# Patient Record
Sex: Female | Born: 1985
Health system: Southern US, Community
[De-identification: ages and names within clinical notes are randomized; demographics above are authoritative.]

## PROBLEM LIST (undated history)

## (undated) DIAGNOSIS — J189 Pneumonia, unspecified organism: Secondary | ICD-10-CM

## (undated) DIAGNOSIS — R51 Headache: Secondary | ICD-10-CM

## (undated) HISTORY — DX: Pneumonia, unspecified organism: J18.9

## (undated) HISTORY — PX: DENTAL SURGERY: SHX609

## (undated) HISTORY — DX: Headache: R51

---

## 2006-05-02 ENCOUNTER — Other Ambulatory Visit: Admission: RE | Admit: 2006-05-02 | Discharge: 2006-05-02 | Payer: Self-pay | Admitting: Gynecology

## 2006-07-18 ENCOUNTER — Ambulatory Visit: Payer: Self-pay | Admitting: Internal Medicine

## 2006-07-18 LAB — CONVERTED CEMR LAB
HCT: 35.8 % — ABNORMAL LOW (ref 36.0–46.0)
MCHC: 35 g/dL (ref 30.0–36.0)
MCV: 88.4 fL (ref 78.0–100.0)
Platelets: 235 10*3/uL (ref 150–400)
RDW: 11.8 % (ref 11.5–14.6)
WBC: 7.1 10*3/uL (ref 4.5–10.5)

## 2006-08-06 ENCOUNTER — Ambulatory Visit: Payer: Self-pay | Admitting: Internal Medicine

## 2007-04-06 ENCOUNTER — Ambulatory Visit: Payer: Self-pay | Admitting: Internal Medicine

## 2007-04-07 ENCOUNTER — Telehealth (INDEPENDENT_AMBULATORY_CARE_PROVIDER_SITE_OTHER): Payer: Self-pay | Admitting: *Deleted

## 2007-04-07 ENCOUNTER — Ambulatory Visit: Payer: Self-pay | Admitting: Internal Medicine

## 2007-04-07 LAB — CONVERTED CEMR LAB
Basophils Relative: 0.2 % (ref 0.0–1.0)
Cholesterol: 197 mg/dL (ref 0–200)
Glucose, Bld: 79 mg/dL (ref 70–99)
Monocytes Relative: 4.2 % (ref 3.0–11.0)
Neutro Abs: 5.8 10*3/uL (ref 1.4–7.7)
Platelets: 186 10*3/uL (ref 150–400)
RBC: 4.2 M/uL (ref 3.87–5.11)
RDW: 11.3 % — ABNORMAL LOW (ref 11.5–14.6)
TSH: 0.78 microintl units/mL (ref 0.35–5.50)
Transferrin: 283.8 mg/dL (ref >=212.0)

## 2007-09-07 ENCOUNTER — Other Ambulatory Visit: Admission: RE | Admit: 2007-09-07 | Discharge: 2007-09-07 | Payer: Self-pay | Admitting: Gynecology

## 2007-11-03 ENCOUNTER — Telehealth (INDEPENDENT_AMBULATORY_CARE_PROVIDER_SITE_OTHER): Payer: Self-pay | Admitting: *Deleted

## 2008-08-29 DIAGNOSIS — J189 Pneumonia, unspecified organism: Secondary | ICD-10-CM

## 2008-08-29 HISTORY — DX: Pneumonia, unspecified organism: J18.9

## 2009-03-29 DIAGNOSIS — R519 Headache, unspecified: Secondary | ICD-10-CM

## 2009-03-29 HISTORY — PX: BREAST ENHANCEMENT SURGERY: SHX7

## 2009-03-29 HISTORY — DX: Headache, unspecified: R51.9

## 2009-04-10 ENCOUNTER — Ambulatory Visit: Payer: Self-pay | Admitting: Internal Medicine

## 2009-04-11 ENCOUNTER — Encounter: Admission: RE | Admit: 2009-04-11 | Discharge: 2009-04-11 | Payer: Self-pay | Admitting: Internal Medicine

## 2009-04-11 ENCOUNTER — Ambulatory Visit: Payer: Self-pay | Admitting: Internal Medicine

## 2009-04-17 LAB — CONVERTED CEMR LAB
BUN: 13 mg/dL (ref 6–23)
Basophils Relative: 3 % (ref 0.0–3.0)
Chloride: 106 meq/L (ref 96–112)
Creatinine, Ser: 0.9 mg/dL (ref 0.4–1.2)
HCT: 37.6 % (ref 36.0–46.0)
Hemoglobin: 13 g/dL (ref 12.0–15.0)
Lymphocytes Relative: 30 % (ref 12.0–46.0)
Lymphs Abs: 1.4 10*3/uL (ref 0.7–4.0)
MCHC: 34.6 g/dL (ref 30.0–36.0)
Monocytes Relative: 7 % (ref 3.0–12.0)
Neutro Abs: 1.6 10*3/uL (ref 1.4–7.7)
RBC: 4.19 M/uL (ref 3.87–5.11)
RDW: 11.3 % — ABNORMAL LOW (ref 11.5–14.6)
WBC: 7.1 10*3/uL (ref 4.5–10.5)

## 2009-04-28 ENCOUNTER — Ambulatory Visit: Payer: Self-pay | Admitting: Internal Medicine

## 2009-04-28 DIAGNOSIS — G43909 Migraine, unspecified, not intractable, without status migrainosus: Secondary | ICD-10-CM | POA: Insufficient documentation

## 2009-05-01 ENCOUNTER — Telehealth: Payer: Self-pay | Admitting: Internal Medicine

## 2009-07-18 ENCOUNTER — Emergency Department (HOSPITAL_BASED_OUTPATIENT_CLINIC_OR_DEPARTMENT_OTHER): Admission: EM | Admit: 2009-07-18 | Discharge: 2009-07-18 | Payer: Self-pay | Admitting: Emergency Medicine

## 2009-07-18 ENCOUNTER — Ambulatory Visit: Payer: Self-pay | Admitting: Diagnostic Radiology

## 2009-08-11 ENCOUNTER — Ambulatory Visit: Payer: Self-pay | Admitting: Internal Medicine

## 2009-08-11 LAB — CONVERTED CEMR LAB
Bacteria, UA: NONE SEEN
Blood in Urine, dipstick: NEGATIVE
Ketones, urine, test strip: NEGATIVE
Nitrite: NEGATIVE
Urobilinogen, UA: 0.2

## 2009-08-12 ENCOUNTER — Encounter: Payer: Self-pay | Admitting: Internal Medicine

## 2009-11-02 ENCOUNTER — Ambulatory Visit: Payer: Self-pay | Admitting: Internal Medicine

## 2009-11-02 DIAGNOSIS — R21 Rash and other nonspecific skin eruption: Secondary | ICD-10-CM

## 2009-12-27 ENCOUNTER — Ambulatory Visit: Payer: Self-pay | Admitting: Gynecology

## 2009-12-27 ENCOUNTER — Other Ambulatory Visit: Admission: RE | Admit: 2009-12-27 | Discharge: 2009-12-27 | Payer: Self-pay | Admitting: Gynecology

## 2010-04-19 ENCOUNTER — Ambulatory Visit: Payer: Self-pay | Admitting: Family Medicine

## 2010-04-19 ENCOUNTER — Ambulatory Visit: Payer: Self-pay | Admitting: Diagnostic Radiology

## 2010-04-19 ENCOUNTER — Ambulatory Visit (HOSPITAL_BASED_OUTPATIENT_CLINIC_OR_DEPARTMENT_OTHER): Admission: RE | Admit: 2010-04-19 | Discharge: 2010-04-19 | Payer: Self-pay | Admitting: Family Medicine

## 2010-08-02 ENCOUNTER — Ambulatory Visit
Admission: RE | Admit: 2010-08-02 | Discharge: 2010-08-02 | Payer: Self-pay | Source: Home / Self Care | Attending: Internal Medicine | Admitting: Internal Medicine

## 2010-08-02 DIAGNOSIS — J019 Acute sinusitis, unspecified: Secondary | ICD-10-CM | POA: Insufficient documentation

## 2010-08-19 ENCOUNTER — Encounter: Payer: Self-pay | Admitting: Internal Medicine

## 2010-08-28 NOTE — Assessment & Plan Note (Signed)
Summary: cough//kn   Vital Signs:  Patient profile:   25 year old female Height:      65.5 inches (166.37 cm) Weight:      139.25 pounds (63.30 kg) BMI:     22.90 O2 Sat:      98 % on Room air Temp:     98.2 degrees F (36.78 degrees C) oral Pulse rate:   66 / minute BP sitting:   110 / 76  (left arm) Cuff size:   regular  Vitals Entered By: Lucious Groves CMA (April 19, 2010 11:49 AM)  O2 Flow:  Room air CC: C/O cough x2 weeks that has gotten progressively worse in the last 3 days./kb Is Patient Diabetic? No Pain Assessment Patient in pain? yes     Location: back Intensity: 3-4 Type: aching Onset of pain  continues, worse with cough Comments Patient notes that she had a history of pneumonia last year. Patient also had a cold 2 weeks ago, which to her, appeared to have cleared up./kb   History of Present Illness: 25 yo woman here today w/ cough.  pt had a cold 2 weeks ago and then developed a persistant dry cough.  otherwise felt well.  started taking Mucinex nightly w/ good relief.  over the last 3 days sxs have again worsened.  pt had PNA last year- had associated back pain.  now again having back pain.  no fevers.  cough is nonproductive.  no ear pain, nasal congestion.  no known sick contacts.  Current Medications (verified): 1)  Yasmin 28 3-0.03 Mg Tabs (Drospirenone-Ethinyl Estradiol) .... Per Gyn  Allergies (verified): No Known Drug Allergies  Past History:  Past Medical History: Last updated: 04/28/2009 pneumonia Dx RML 2008 pneumonia Dx 03-2009 HA (migraine Dx 03-2009  and  also mild recurrent HAs)  Review of Systems      See HPI  Physical Exam  General:  alert, well-developed, and well-nourished.   Head:  NCAT, no TTP over sinuses Eyes:  no injxn or inflammation Ears:  External ear exam shows no significant lesions or deformities.  Otoscopic examination reveals clear canals, tympanic membranes are intact bilaterally without bulging, retraction,  inflammation or discharge. Hearing is grossly normal bilaterally. Nose:  mild congestion Mouth:  + PND, otherwise normal Lungs:  normal respiratory effort, no intercostal retractions, no accessory muscle use, and normal breath sounds.   Heart:  normal rate, regular rhythm, no murmur, and no gallop.   Cervical Nodes:  No lymphadenopathy noted   Impression & Recommendations:  Problem # 1:  COUGH (ICD-786.2) Assessment New pt's cough initially sounded post-infectious.  now that it is worsening and pt having associated back pain, similar to her previous PNA, will get CXR.  start Cheratussin and tessalon as needed.  will review CXR prior to starting abx.  reviewed supportive care and red flags that should prompt return.  Pt expresses understanding and is in agreement w/ this plan. Orders: T-2 View CXR ([id number])  Complete Medication List: 1)  Yasmin 28 3-0.03 Mg Tabs (Drospirenone-ethinyl estradiol) .... Per gyn 2)  Cheratussin Ac 100-10 Mg/68ml Syrp (Guaifenesin-codeine) .Marland Kitchen.. 1-2 tsp q4-6 as needed for cough.  will cause drowsiness. 3)  Tessalon 200 Mg Caps (Benzonatate) .... Take one capsule by mouth three times a day as needed for cough  Patient Instructions: 1)  Go to the [facility] for your xray- we'll call you with your results 2)  Take the cough syrup at night or on the weekend- it will make  you sleepy 3)  Take the cough pills during the day as needed 4)  You can take the Mucinex w/ the cough pills but not the syrup 5)  Drink plenty of fluids 6)  Hang in there!!! Prescriptions: TESSALON 200 MG CAPS (BENZONATATE) Take one capsule by mouth three times a day as needed for cough  #60 x 0   Entered and Authorized by:   Neena Rhymes MD   Signed by:   Neena Rhymes MD on 04/19/2010   Method used:   Print then Give to Patient   RxID:   [id number] CHERATUSSIN AC 100-10 MG/5ML SYRP (GUAIFENESIN-CODEINE) 1-2 tsp q4-6 as needed for cough.  will cause drowsiness.  #145ml x  0   Entered and Authorized by:   Neena Rhymes MD   Signed by:   Neena Rhymes MD on 04/19/2010   Method used:   Print then Give to Patient   RxID:   [id number]

## 2010-08-28 NOTE — Assessment & Plan Note (Signed)
Summary: darkining brown spots on arm//lch   Vital Signs:  Patient profile:   25 year old female Height:      65.5 inches Weight:      142.4 pounds Pulse rate:   80 / minute BP sitting:   120 / 80  Vitals Entered By: Shary Decamp (November 02, 2009 9:52 AM) CC: brown spots on arms x 1 week, sore on left shoulder   History of Present Illness: one week history of spots in the arms they  started as red and now they are brown no itching no exposure to any new perfumes or poison ivy   also developed an area of infection close to the left shoulder she saw abundant  discharge coming from that area few  days ago now the area is sore and slightly red  Current Medications (verified): 1)  Yasmin 28 3-0.03 Mg Tabs (Drospirenone-Ethinyl Estradiol) .... Per Gyn  Allergies (verified): No Known Drug Allergies  Past History:  Past Medical History: Reviewed history from 04/28/2009 and no changes required. pneumonia Dx RML 2008 pneumonia Dx 03-2009 HA (migraine Dx 03-2009  and  also mild recurrent HAs)  Past Surgical History: Reviewed history from 04/10/2009 and no changes required. dental surgery breast implants 03-2009  Social History: Reviewed history from 08/11/2009 and no changes required. Single no children she is originally from [place] graduated from college , work in finances @ [employer]  Review of Systems       no fever  Physical Exam  General:  alert, well-developed, and well-nourished.   Skin:  macular  lesions, one in the right arm two in the left arm slightly darker than her skin, not scaly,clearly defined borders   chest, close to the left shoulder has a  3/4cm  area of redness   Impression & Recommendations:  Problem # 1:  RASH-NONVESICULAR (ICD-782.1) rash in the arms of unclear etiology, not likely fungal.  Hyperpigmentation from contact dermatitis? Recommend over-the-counter hydrocortisone 0.5% we agree that if  is no better and is cosmetically  bothersome, she will call for a dermatology referral  she has a resolving-small  absces in the left chest close to the  shoulder, according to the patient the area drained discharge  and now is getting more  red in the last 2 days  on examination  there is no fluctuancy keflex    for 5 days to call if not better in a few days  Complete Medication List: 1)  Yasmin 28 3-0.03 Mg Tabs (Drospirenone-ethinyl estradiol) .... Per gyn 2)  Keflex 750 Mg Caps (Cephalexin) .... One by mouth 3 times a day for 5 days  Patient Instructions: 1)  use hydrocortisone cream 0.5% over-the-counter in the arms 2)  keflex for 5 days 3)  call if not better soon Prescriptions: KEFLEX 750 MG CAPS (CEPHALEXIN) one by mouth 3 times a day for 5 days  #15 x 0   Entered and Authorized by:   Nolon Rod. Sara Keys MD   Signed by:   Nolon Rod. Kenesha Moshier MD on 11/02/2009   Method used:   Print then Give to Patient   RxID:   [id number]

## 2010-08-28 NOTE — Assessment & Plan Note (Signed)
Summary: ABDOMINAL PAIN//PH   Vital Signs:  Patient profile:   25 year old female Height:      65.5 inches Weight:      146.8 pounds Temp:     97.9 degrees F BP sitting:   110 / 60  Vitals Entered By: Shary Decamp (August 11, 2009 10:47 AM) CC: ACUTE ONLY Is Patient Diabetic? No Comments Patient was dx with BV on 12/21 @ ED & was rx'd flagyl, 1 week later developed dysuria & urinary freq,  2 days ago noted hematuria, low abd pain Shary Decamp  August 11, 2009 10:48 AM    History of Present Illness: went to the ER December 21 with abdominal pain, she was evaluated and diagnosed with vaginitis, Rx  Flagyl for one week. All her symptoms resolved  a week ago she developed dysuria and frequency yesterday she noted  gross hematuria twice took AZO OTC x1  Current Medications (verified): 1)  Treximet 85-500 Mg Tabs (Sumatriptan-Naproxen Sodium) .Marland Kitchen.. 1 By Mouth With The Onset of Ha The, May Repeat in 2 Hours If The Headac in Thehe Is Not Better 2)  Reglan 10 Mg Tabs (Metoclopramide Hcl) .Marland Kitchen.. 1 By Mouth With Each Treximet  Allergies (verified): No Known Drug Allergies  Past History:  Past Medical History: Reviewed history from 04/28/2009 and no changes required. pneumonia Dx RML 2008 pneumonia Dx 03-2009 HA (migraine Dx 03-2009  and  also mild recurrent HAs)  Past Surgical History: Reviewed history from 04/10/2009 and no changes required. dental surgery breast implants 03-2009  Social History: Reviewed history from 04/06/2007 and no changes required. Single no children she is originally from [place] graduated from college , work in finances @ [employer]  Review of Systems       denies fever no nausea or vomiting no abdominal pain  Physical Exam  General:  alert, well-developed, and well-nourished.   Abdomen:  soft, non-tender, no distention, no masses, no guarding, and no rigidity.  no CVA tenderness   Impression & Recommendations:  Problem # 1:  UTI  (ICD-599.0) sx c/w  UTI, recommend Cipro, push fluids, AZO OK to  take for a couple of days culture pending patient to call if not better in a few days Her updated medication list for this problem includes:    Cipro 500 Mg Tabs (Ciprofloxacin hcl) .Marland Kitchen... 1 by mouth two times a day  Complete Medication List: 1)  Treximet 85-500 Mg Tabs (Sumatriptan-naproxen sodium) .Marland Kitchen.. 1 by mouth with the onset of ha the, may repeat in 2 hours if the headac in thehe is not better 2)  Reglan 10 Mg Tabs (Metoclopramide hcl) .Marland Kitchen.. 1 by mouth with each treximet 3)  Yasmin 28 3-0.03 Mg Tabs (Drospirenone-ethinyl estradiol) .... Per gyn 4)  Cipro 500 Mg Tabs (Ciprofloxacin hcl) .Marland Kitchen.. 1 by mouth two times a day  Other Orders: UA Dipstick w/o Micro (manual) (14782) Specimen Handling (99000) T-Urine Microscopic ([id number]) T-Culture, Urine ([id number]) Prescriptions: CIPRO 500 MG TABS (CIPROFLOXACIN HCL) 1 by mouth two times a day  #10 x 0   Entered and Authorized by:   Nolon Rod. Elyssa Pendelton MD   Signed by:   Nolon Rod. Kellie Chisolm MD on 08/11/2009   Method used:   Print then Give to Patient   RxID:   [id number]   Laboratory Results   Urine Tests    Routine Urinalysis   Glucose: negative   (Normal Range: Negative) Bilirubin: negative   (Normal Range: Negative) Ketone: negative   (Normal Range: Negative) Spec.  Gravity: 1.010   (Normal Range: 1.003-1.035) Blood: negative   (Normal Range: Negative) pH: 5.0   (Normal Range: 5.0-8.0) Protein: negative   (Normal Range: Negative) Urobilinogen: 0.2   (Normal Range: 0-1) Nitrite: negative   (Normal Range: Negative) Leukocyte Esterace: trace   (Normal Range: Negative)

## 2010-08-30 NOTE — Assessment & Plan Note (Signed)
Summary: heavy cold,  went away, now its back///sph   Vital Signs:  Patient profile:   25 year old female Weight:      144 pounds Temp:     98.7 degrees F oral Pulse rate:   92 / minute Pulse rhythm:   regular BP sitting:   112 / 82  (left arm) Cuff size:   regular  Vitals Entered By: Army Fossa CMA (August 02, 2010 11:43 AM) CC: Pt here c/o Head/nasal congestion  Comments x 3 days taking dayquil and theraflu [facility]    History of Present Illness: Developed a cold 2 weeks ago, gradually got better. 3 days ago, her symptoms resurface, this time the main symptom is head and nose congestion. She has clear nasal discharge.  ROS No fever No sore throat Ears feel "full" No nausea, vomiting, diarrhea Chest is not congested, no cough  Current Medications (verified): 1)  Yasmin 28 3-0.03 Mg Tabs (Drospirenone-Ethinyl Estradiol) .... Per Gyn  Allergies (verified): No Known Drug Allergies  Past History:  Past Medical History: Reviewed history from 04/28/2009 and no changes required. pneumonia Dx RML 2008 pneumonia Dx 03-2009 HA (migraine Dx 03-2009  and  also mild recurrent HAs)  Past Surgical History: Reviewed history from 04/10/2009 and no changes required. dental surgery breast implants 03-2009  Social History: Reviewed history from 08/11/2009 and no changes required. Single no children she is originally from [place] graduated from college , work in finances @ [employer]  Physical Exam  General:  alert, well-developed, and well-nourished.   Head:  face symmetric, moderately tender at the right maxillary area Ears:  TMs slightly bulged, no red Nose:  congested Mouth:  no redness Lungs:  normal respiratory effort, no intercostal retractions, no accessory muscle use, and normal breath sounds.   Heart:  normal rate, regular rhythm, no murmur, and no gallop.     Impression & Recommendations:  Problem # 1:  SINUSITIS - ACUTE-NOS  (ICD-461.9) clinical picture consistent with acute sinusitis see  instructions   Her updated medication list for this problem includes:    Amoxicillin 500 Mg Tabs (Amoxicillin) .Marland Kitchen... 2 by mouth two times a day  Complete Medication List: 1)  Yasmin 28 3-0.03 Mg Tabs (Drospirenone-ethinyl estradiol) .... Per gyn 2)  Amoxicillin 500 Mg Tabs (Amoxicillin) .... 2 by mouth two times a day  Patient Instructions: 1)  rest, fluids, tylenol 2)  sudafed (behind the couinter) 30mg  4 times a day as needed 3)  mucinex 1 tab two times a day  4)  amoxicillin x 10 days 5)  omnaris 2 sprays on each side of the nose  once a  until samples gone 6)  call if no better in few days 7)  call if symptoms severe Prescriptions: AMOXICILLIN 500 MG TABS (AMOXICILLIN) 2 by mouth two times a day  #40 x 0   Entered and Authorized by:   Elita Quick E. Paz MD   Signed by:   Nolon Rod. Paz MD on 08/02/2010   Method used:   Print then Give to Patient   RxID:   [id number]    Orders Added: 1)  Est. Patient Level III [14782]

## 2010-10-29 LAB — BASIC METABOLIC PANEL
Calcium: 9.4 mg/dL (ref 8.4–10.5)
GFR calc Af Amer: >60 mL/min (ref >60)
GFR calc non Af Amer: >60 mL/min (ref >60)
Potassium: 4.3 mEq/L (ref 3.5–5.1)
Sodium: 142 mEq/L (ref 135–145)

## 2010-10-29 LAB — DIFFERENTIAL
Basophils Absolute: 0 10*3/uL (ref 0.0–0.1)
Basophils Relative: 1 % (ref 0–1)
Eosinophils Absolute: 0.5 10*3/uL (ref 0.0–0.7)
Eosinophils Relative: 6 % — ABNORMAL HIGH (ref 0–5)
Lymphocytes Relative: 28 % (ref 12–46)
Lymphs Abs: 2.3 10*3/uL (ref 0.7–4.0)
Monocytes Absolute: 0.4 10*3/uL (ref 0.1–1.0)
Monocytes Relative: 5 % (ref 3–12)
Neutro Abs: 5.3 10*3/uL (ref 1.7–7.7)
Neutrophils Relative %: 62 % (ref 43–77)

## 2010-10-29 LAB — URINALYSIS, ROUTINE W REFLEX MICROSCOPIC
Bilirubin Urine: NEGATIVE
Glucose, UA: NEGATIVE mg/dL
Hgb urine dipstick: NEGATIVE
Ketones, ur: NEGATIVE mg/dL
Nitrite: NEGATIVE
Protein, ur: NEGATIVE mg/dL
Specific Gravity, Urine: 1.019 (ref 1.005–1.030)
Urobilinogen, UA: 0.2 mg/dL (ref 0.0–1.0)
pH: 6 (ref 5.0–8.0)

## 2010-10-29 LAB — WET PREP, GENITAL
Trich, Wet Prep: NONE SEEN
Yeast Wet Prep HPF POC: NONE SEEN

## 2010-10-29 LAB — CBC
Hemoglobin: 13.5 g/dL (ref 12.0–15.0)
RBC: 4.36 MIL/uL (ref 3.87–5.11)
RDW: 11.3 % — ABNORMAL LOW (ref 11.5–15.5)
WBC: 8.5 10*3/uL (ref 4.0–10.5)

## 2010-10-29 LAB — PREGNANCY, URINE: Preg Test, Ur: NEGATIVE

## 2010-10-29 LAB — URINE MICROSCOPIC-ADD ON

## 2010-10-29 LAB — GC/CHLAMYDIA PROBE AMP, GENITAL
Chlamydia, DNA Probe: NEGATIVE
GC Probe Amp, Genital: NEGATIVE

## 2011-01-18 ENCOUNTER — Encounter: Payer: Self-pay | Admitting: Gynecology

## 2011-01-24 ENCOUNTER — Encounter: Payer: Self-pay | Admitting: Gynecology

## 2011-01-29 ENCOUNTER — Other Ambulatory Visit (HOSPITAL_COMMUNITY)
Admission: RE | Admit: 2011-01-29 | Discharge: 2011-01-29 | Disposition: A | Discharge status: Home / Self Care | Payer: 59 | Source: Ambulatory Visit | Attending: Gynecology | Admitting: Gynecology

## 2011-01-29 ENCOUNTER — Encounter (INDEPENDENT_AMBULATORY_CARE_PROVIDER_SITE_OTHER): Payer: 59 | Admitting: Gynecology

## 2011-01-29 ENCOUNTER — Other Ambulatory Visit: Payer: Self-pay | Admitting: Gynecology

## 2011-01-29 DIAGNOSIS — Z124 Encounter for screening for malignant neoplasm of cervix: Secondary | ICD-10-CM | POA: Insufficient documentation

## 2011-01-29 DIAGNOSIS — B373 Candidiasis of vulva and vagina: Secondary | ICD-10-CM

## 2011-01-29 DIAGNOSIS — Z113 Encounter for screening for infections with a predominantly sexual mode of transmission: Secondary | ICD-10-CM

## 2011-01-29 DIAGNOSIS — Z01419 Encounter for gynecological examination (general) (routine) without abnormal findings: Secondary | ICD-10-CM

## 2011-01-31 ENCOUNTER — Encounter: Payer: Self-pay | Admitting: *Deleted

## 2011-02-12 ENCOUNTER — Ambulatory Visit (INDEPENDENT_AMBULATORY_CARE_PROVIDER_SITE_OTHER): Payer: 59 | Admitting: Internal Medicine

## 2011-02-12 ENCOUNTER — Encounter: Payer: Self-pay | Admitting: Internal Medicine

## 2011-02-12 DIAGNOSIS — L709 Acne, unspecified: Secondary | ICD-10-CM | POA: Insufficient documentation

## 2011-02-12 DIAGNOSIS — L708 Other acne: Secondary | ICD-10-CM

## 2011-02-12 MED ORDER — CLINDAMYCIN PHOSPHATE 1 % EX GEL: CUTANEOUS | Status: DC

## 2011-02-12 NOTE — Progress Notes (Signed)
  Subjective:    Patient ID: Sara Moran, female    DOB: 02-Oct-1985, 25 y.o.   MRN: [id number]  HPI For the last 2 weeks has noted some acne,  Describes it as bumps under the skin, no blackheads. Usually when it gets better  it leaves "scars" (patient is referring to hyperpigmentation rather than scars)  Past Medical History  Diagnosis Date  . Pneumonia     dx RML 2008  . Pneumonia 08/2008  . HA (headache) 03/2009    migraine and also mild recurrent HAs   Past Surgical History  Procedure Date  . Breast enhancement surgery 03-2009    silicone  . Dental surgery      Review of Systems Has not  taken any new medication, has not changed her diet. She did skip her control pills for 2 months, when back to take yaz 4 weeks ago    Objective:   Physical Exam Alert oriented, no apparent distress. Face  with several small bumps around the nose and the mouth. There are consistent with  acne. No redness, no fluctuance, no actual scar, mild   hyperpigmentation in some areas

## 2011-02-12 NOTE — Assessment & Plan Note (Signed)
Mild acne, related to birth control pills? See history of present illness. Plan to treat her with clindamycin, for 2 months. If no better we'll consider a different antibiotic or benzoyl. See instructions

## 2011-02-12 NOTE — Patient Instructions (Signed)
Use clinda twice a day x 2 months , if no better or if acne returns, let me know

## 2011-03-24 IMAGING — CR DG CHEST 2V
2 series · 2 of 2 positions shown · non-contrast
Comparison: 04/11/2009

CLINICAL DATA: Cough and upper back pain.  History of pneumonia and
breast implants

CHEST - 2 VIEW

[w chest pa]
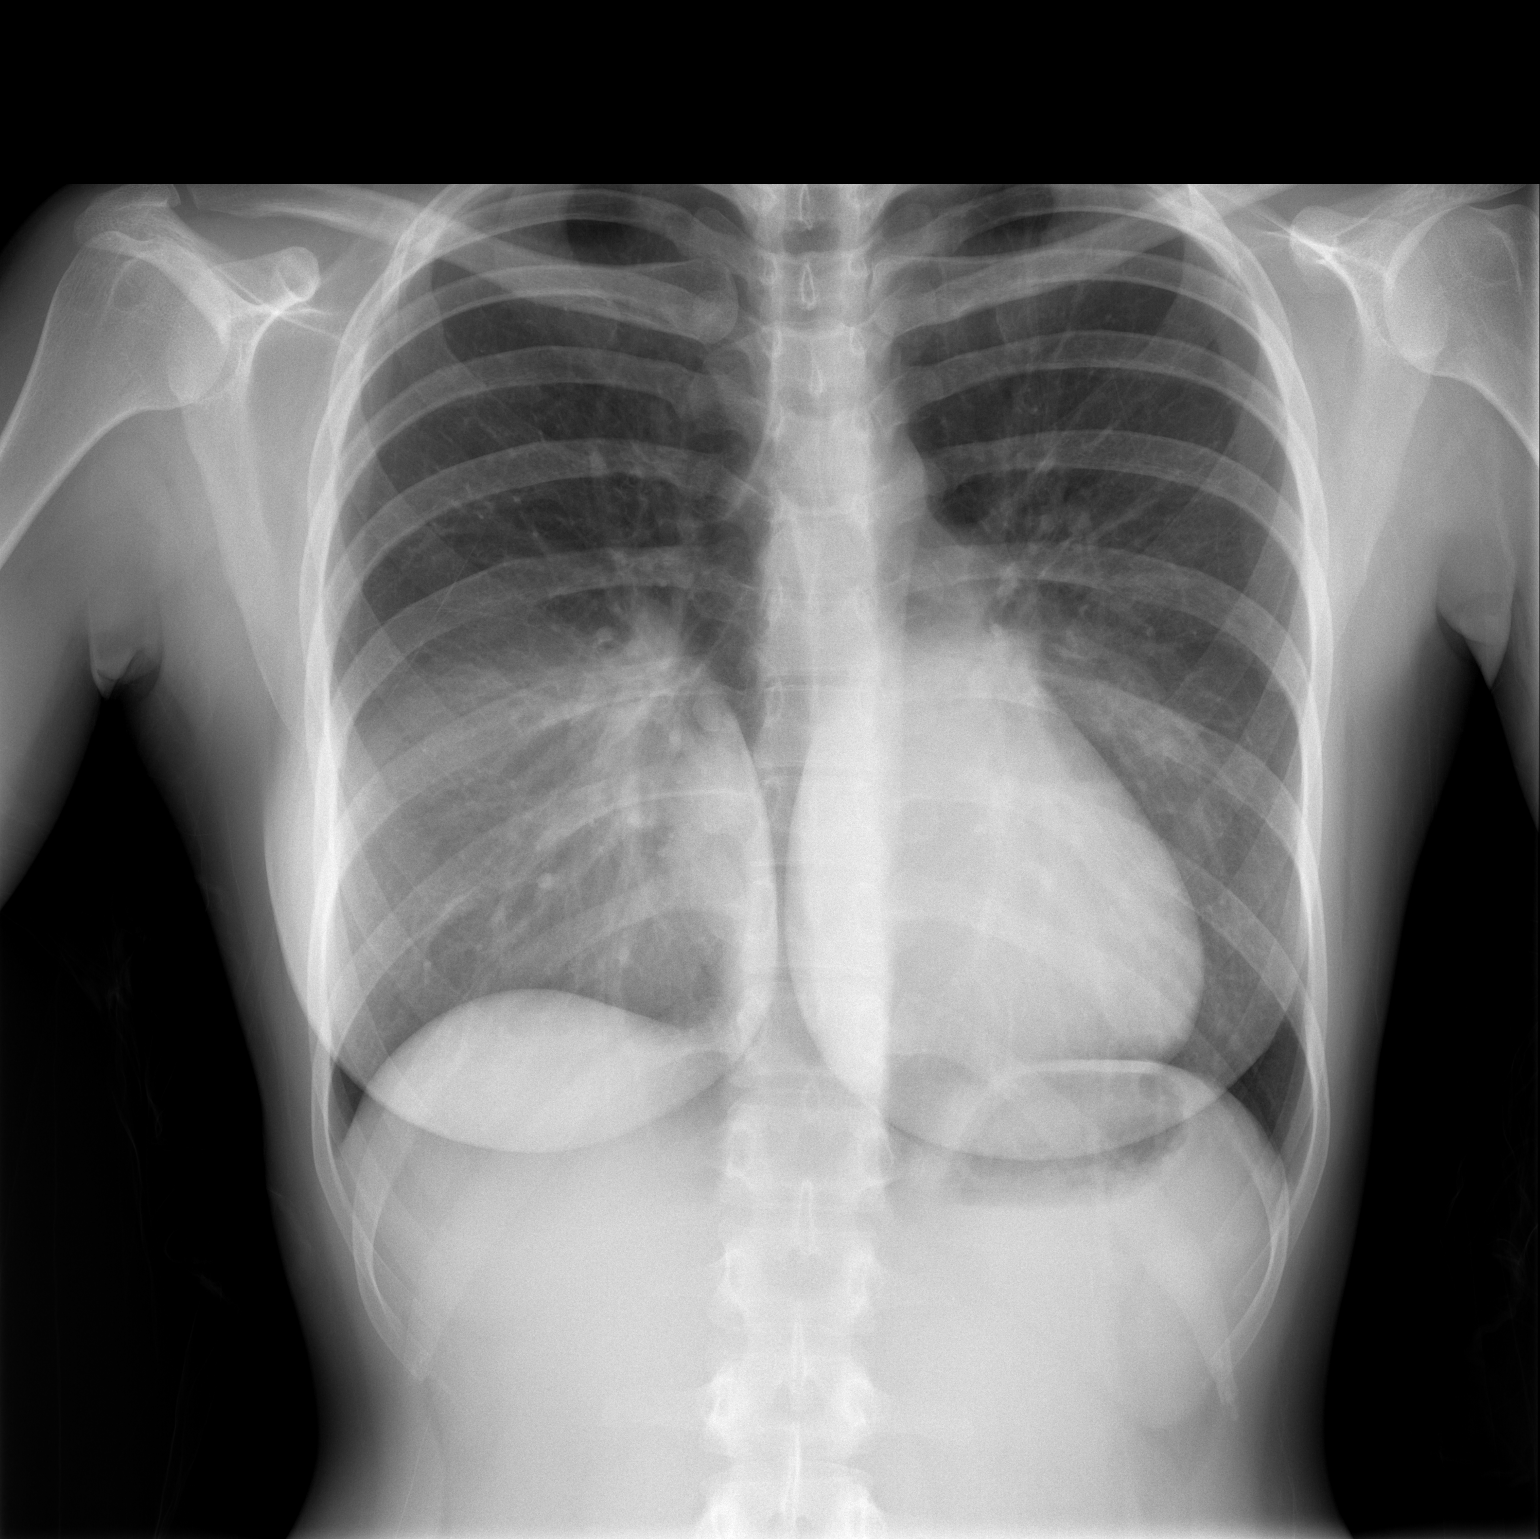

[w chest lat]
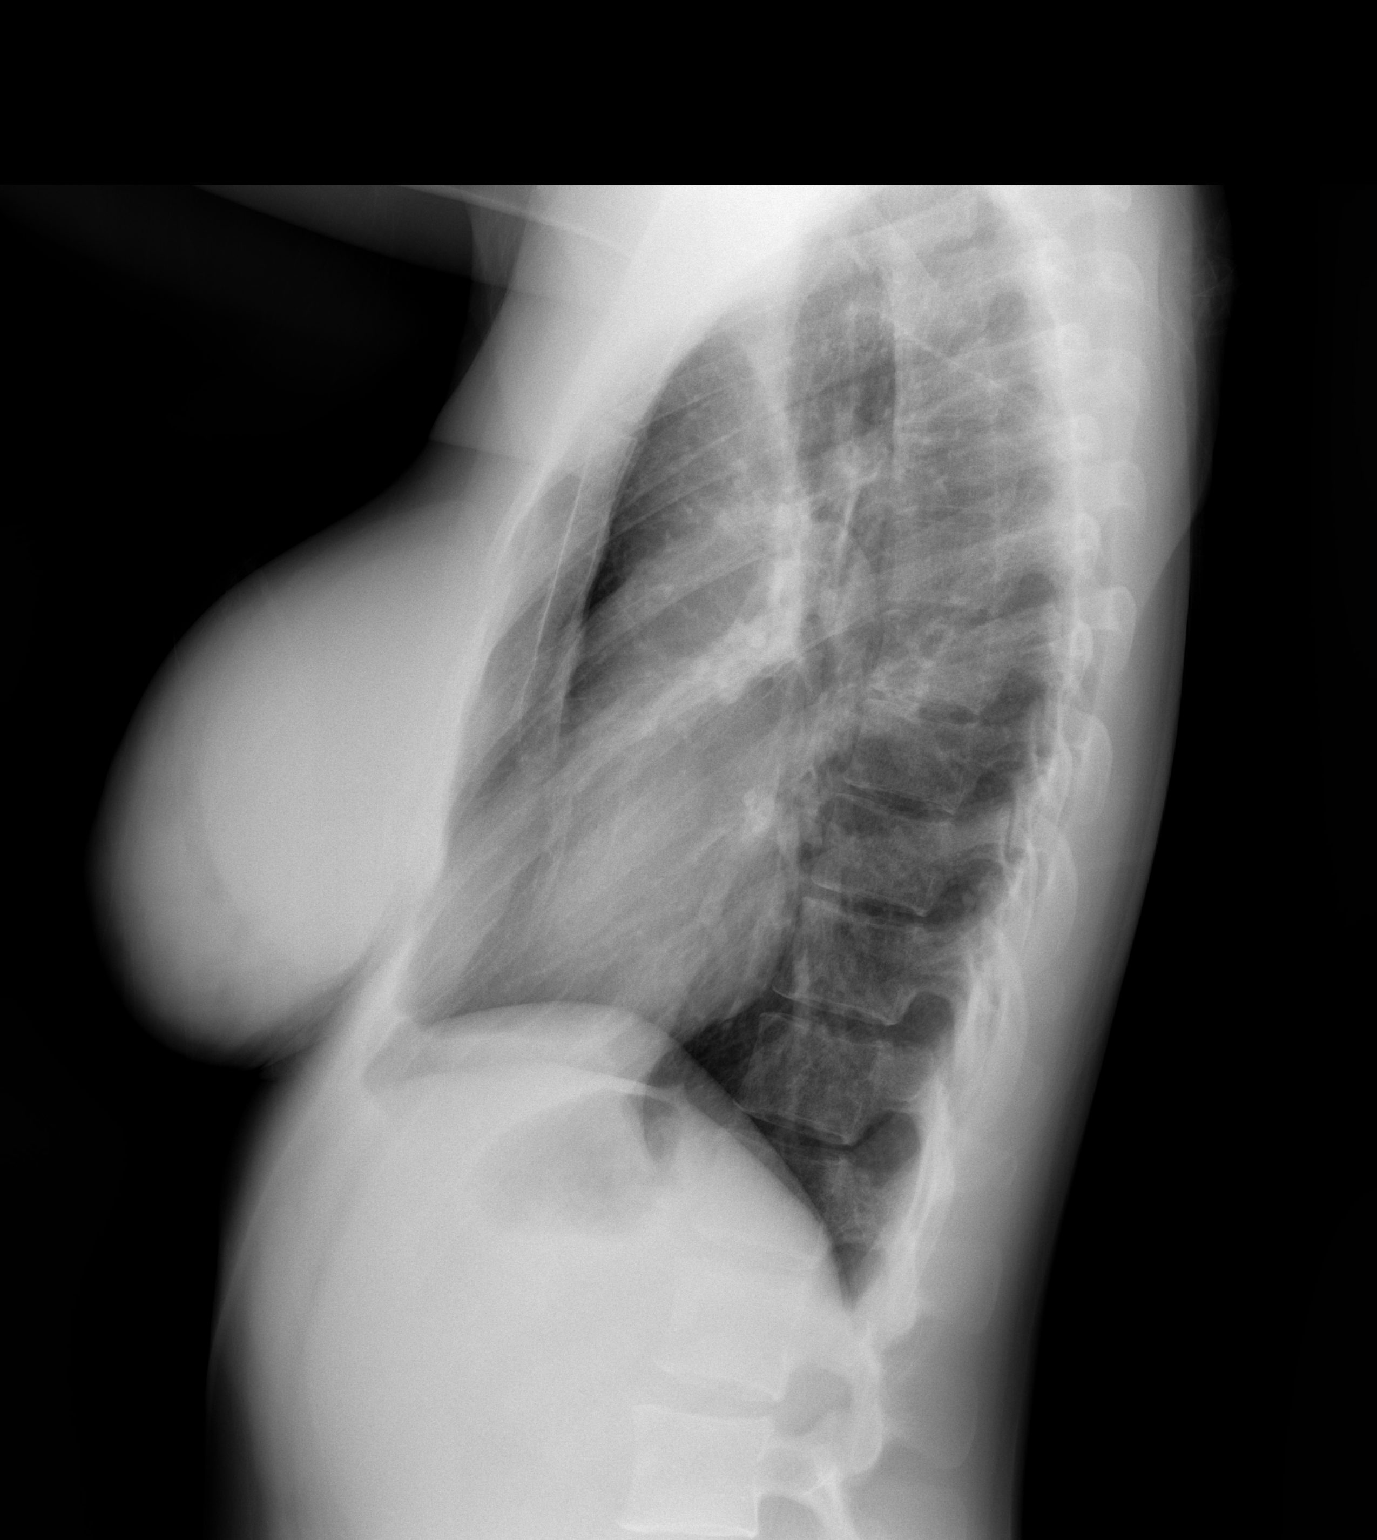

[2 of 2 positions shown; findings below may reference images not displayed]

FINDINGS: Bilateral breast implants are in place and result in an
increase in density over the lower aspect of the chest on the
frontal exam.  Heart and mediastinal contours are within normal
limits.  The lung fields are clear with no signs of focal
infiltrate or congestive failure.  No pleural fluid or
peribronchial cuffing is seen.

Bony structures are intact.
IMPRESSION: No acute or new focal cardiopulmonary abnormality noted.

## 2011-06-17 ENCOUNTER — Encounter: Payer: Self-pay | Admitting: Internal Medicine

## 2011-06-17 ENCOUNTER — Ambulatory Visit (INDEPENDENT_AMBULATORY_CARE_PROVIDER_SITE_OTHER): Payer: 59 | Admitting: Internal Medicine

## 2011-06-17 VITALS — BP 110/70 | HR 66 | Temp 98.8°F | Ht 65.0 in | Wt 143.0 lb

## 2011-06-17 DIAGNOSIS — L709 Acne, unspecified: Secondary | ICD-10-CM

## 2011-06-17 DIAGNOSIS — R196 Halitosis: Secondary | ICD-10-CM

## 2011-06-17 DIAGNOSIS — R6889 Other general symptoms and signs: Secondary | ICD-10-CM

## 2011-06-17 DIAGNOSIS — L708 Other acne: Secondary | ICD-10-CM

## 2011-06-17 MED ORDER — FLUTICASONE PROPIONATE 50 MCG/ACT NA SUSP: 2.0000 | Freq: Every day | NASAL | Status: DC

## 2011-06-17 MED ORDER — CLINDAMYCIN PHOSPHATE 1 % EX GEL: CUTANEOUS | Status: AC

## 2011-06-17 NOTE — Assessment & Plan Note (Signed)
Request a refill on Clindagel which is provided

## 2011-06-17 NOTE — Patient Instructions (Signed)
flonase x several weeks Prilosec 20 mg OTC 1 every morning before breakfast x 6 weeks

## 2011-06-17 NOTE — Progress Notes (Signed)
  Subjective:    Patient ID: Sara Moran, female    DOB: 1986-07-28, 25 y.o.   MRN: [id number]  HPI 2 months history of a metallic taste in her mouth and also has halithosis. Worse for the last 2 weeks. She is very careful with flossing, dental hygine and  dental appointments. Wonders if it is her tonsils that are causing her sx   Past Medical History  Diagnosis Date  . Pneumonia     dx RML 2008  . Pneumonia 08/2008  . HA (headache) 03/2009    migraine and also mild recurrent HAs   Past Surgical History  Procedure Date  . Breast enhancement surgery 03-2009    silicone  . Dental surgery      Review of Systems No fever or chills No actual sore throat or recent URIs No cough Occasionally has heartburn occational has postnasal drip without itching in the nose or eyes    Objective:   Physical Exam  Constitutional: She appears well-developed.  HENT:  Head: Normocephalic and atraumatic.  Right Ear: External ear normal.  Left Ear: External ear normal.  Nose: Nose normal.  Mouth/Throat: No oropharyngeal exudate.       Tonsils very small without discharge  Cardiovascular: Normal rate, regular rhythm and normal heart sounds.   No murmur heard. Pulmonary/Chest: Effort normal and breath sounds normal. No respiratory distress. She has no wheezes. She has no rales.  Lymphadenopathy:    She has no cervical adenopathy.       Assessment & Plan:  Halitosis: No apparent dental issues, review of systems positive for postnasal dripping and GERD. Will treat both conditions with Flonase and Prilosec, if not better she'll let me know.

## 2011-08-09 ENCOUNTER — Ambulatory Visit (INDEPENDENT_AMBULATORY_CARE_PROVIDER_SITE_OTHER): Payer: 59 | Admitting: Internal Medicine

## 2011-08-09 VITALS — BP 100/62 | HR 79 | Temp 98.7°F | Wt 143.0 lb

## 2011-08-09 DIAGNOSIS — R05 Cough: Secondary | ICD-10-CM

## 2011-08-09 NOTE — Patient Instructions (Signed)
Get the XR done Rest, fluids , tylenol For cough, take Mucinex DM twice a day as needed  Call if no better in few days Call anytime if the symptoms are severe, you have high fever, short of breath , chest pain

## 2011-08-09 NOTE — Progress Notes (Signed)
  Subjective:    Patient ID: Sara Moran, female    DOB: [birth year], 25 y.o.   MRN: [id number]  HPI Acute visit 5 days history of cough, taking Mucinex which is helping a little. She is concerned because she has developed thoracic pain for the last 2 days, is worse with cough. Denies any anterior or lateral chest pain.  Past Medical History: pneumonia Dx RML 2008 pneumonia Dx 03-2009 HA (migraine Dx 03-2009  and  also mild recurrent HAs)  Past Surgical History: dental surgery breast implants 03-2009  Social History: Married, G0P0 she is originally from [place] graduated from college , work in finances @ [employer]  Review of Systems No fever or chills No nausea or vomiting No actual sputum production. No sinus congestion or pain.    Objective:   Physical Exam  Constitutional: She is oriented to person, place, and time. She appears well-developed and well-nourished.  HENT:  Head: Normocephalic and atraumatic.  Right Ear: External ear normal.  Left Ear: External ear normal.  Nose: Nose normal.  Mouth/Throat: No oropharyngeal exudate.  Cardiovascular: Normal rate, regular rhythm and normal heart sounds.   No murmur heard. Pulmonary/Chest: Effort normal and breath sounds normal. No respiratory distress. She has no wheezes. She has no rales.  Musculoskeletal: She exhibits no edema.  Neurological: She is alert and oriented to person, place, and time.     Assessment & Plan:  Cough: Presents w/ a h/o cough with thoracic pain. Exam is benign. She is concerned because her history of previous pneumonia twice. I entered a  order for a chest x-ray to be sure she is not developing pneumonia, she likes to wait a couple of days but will proceed with a chest x-ray next week if she's not feeling better. Recommend conservative treatment for now, seen instructions

## 2011-08-10 ENCOUNTER — Encounter: Payer: Self-pay | Admitting: Internal Medicine

## 2012-10-16 ENCOUNTER — Encounter: Payer: 59 | Admitting: Gynecology

## 2012-10-20 ENCOUNTER — Ambulatory Visit: Payer: 59 | Admitting: Internal Medicine

## 2012-10-22 ENCOUNTER — Encounter: Payer: 59 | Admitting: Gynecology

## 2012-10-28 ENCOUNTER — Telehealth: Payer: Self-pay

## 2012-10-28 ENCOUNTER — Other Ambulatory Visit (HOSPITAL_COMMUNITY)
Admission: RE | Admit: 2012-10-28 | Discharge: 2012-10-28 | Disposition: A | Discharge status: Home / Self Care | Payer: BC Managed Care – PPO | Source: Ambulatory Visit | Attending: Gynecology | Admitting: Gynecology

## 2012-10-28 ENCOUNTER — Ambulatory Visit (HOSPITAL_COMMUNITY)
Admission: RE | Admit: 2012-10-28 | Discharge: 2012-10-28 | Disposition: A | Discharge status: Home / Self Care | Payer: BC Managed Care – PPO | Source: Ambulatory Visit | Attending: Gynecology | Admitting: Gynecology

## 2012-10-28 ENCOUNTER — Encounter: Payer: Self-pay | Admitting: Gynecology

## 2012-10-28 ENCOUNTER — Ambulatory Visit (INDEPENDENT_AMBULATORY_CARE_PROVIDER_SITE_OTHER): Payer: BC Managed Care – PPO | Admitting: Gynecology

## 2012-10-28 VITALS — BP 112/70 | Ht 65.75 in | Wt 145.0 lb

## 2012-10-28 DIAGNOSIS — A499 Bacterial infection, unspecified: Secondary | ICD-10-CM

## 2012-10-28 DIAGNOSIS — Q676 Pectus excavatum: Secondary | ICD-10-CM | POA: Insufficient documentation

## 2012-10-28 DIAGNOSIS — J3489 Other specified disorders of nose and nasal sinuses: Secondary | ICD-10-CM | POA: Insufficient documentation

## 2012-10-28 DIAGNOSIS — Z01419 Encounter for gynecological examination (general) (routine) without abnormal findings: Secondary | ICD-10-CM

## 2012-10-28 DIAGNOSIS — R059 Cough, unspecified: Secondary | ICD-10-CM | POA: Insufficient documentation

## 2012-10-28 DIAGNOSIS — N760 Acute vaginitis: Secondary | ICD-10-CM

## 2012-10-28 DIAGNOSIS — R05 Cough: Secondary | ICD-10-CM

## 2012-10-28 DIAGNOSIS — Z3041 Encounter for surveillance of contraceptive pills: Secondary | ICD-10-CM

## 2012-10-28 DIAGNOSIS — N898 Other specified noninflammatory disorders of vagina: Secondary | ICD-10-CM

## 2012-10-28 LAB — CBC WITH DIFFERENTIAL/PLATELET
Basophils Relative: 0 % (ref 0–1)
Hemoglobin: 13.7 g/dL (ref 12.0–15.0)
Lymphocytes Relative: 29 % (ref 12–46)
MCHC: 34.4 g/dL (ref 30.0–36.0)
Monocytes Relative: 6 % (ref 3–12)
Neutro Abs: 3.8 10*3/uL (ref 1.7–7.7)
Neutrophils Relative %: 58 % (ref 43–77)
RBC: 4.75 MIL/uL (ref 3.87–5.11)
WBC: 6.5 10*3/uL (ref 4.0–10.5)

## 2012-10-28 LAB — WET PREP FOR TRICH, YEAST, CLUE: Trich, Wet Prep: NONE SEEN

## 2012-10-28 LAB — GLUCOSE, RANDOM: Glucose, Bld: 79 mg/dL (ref 70–99)

## 2012-10-28 LAB — TSH: TSH: 2.36 u[IU]/mL (ref 0.350–4.500)

## 2012-10-28 MED ORDER — DROSPIRENONE-ETHINYL ESTRADIOL 3-0.03 MG PO TABS: 1.0000 | ORAL_TABLET | Freq: Every day | ORAL | Status: DC

## 2012-10-28 NOTE — Telephone Encounter (Signed)
After several phone calls I located TB tine test at [facility].  I called patient back and told her she could walk-in for test there. $27 dollars and they do not file insurance.  She can go by anytime except Thurs and must be before 4:30.  She will plan to go this week.  She does not have to be [employer].

## 2012-10-28 NOTE — Patient Instructions (Addendum)
Tetanus, Diphtheria, Pertussis (Tdap) Vaccine What You Need to Know WHY GET VACCINATED? Tetanus, diphtheria and pertussis can be very serious diseases, even for adolescents and adults. Tdap vaccine can protect us from these diseases. TETANUS (Lockjaw) causes painful muscle tightening and stiffness, usually all over the body.  It can lead to tightening of muscles in the head and neck so you can't open your mouth, swallow, or sometimes even breathe. Tetanus kills about 1 out of 5 people who are infected. DIPHTHERIA can cause a thick coating to form in the back of the throat.  It can lead to breathing problems, paralysis, heart failure, and death. PERTUSSIS (Whooping Cough) causes severe coughing spells, which can cause difficulty breathing, vomiting and disturbed sleep.  It can also lead to weight loss, incontinence, and rib fractures. Up to 2 in 100 adolescents and 5 in 100 adults with pertussis are hospitalized or have complications, which could include pneumonia and death. These diseases are caused by bacteria. Diphtheria and pertussis are spread from person to person through coughing or sneezing. Tetanus enters the body through cuts, scratches, or wounds. Before vaccines, the [place] saw as many as 200,000 cases a year of diphtheria and pertussis, and hundreds of cases of tetanus. Since vaccination began, tetanus and diphtheria have dropped by about 99% and pertussis by about 80%. TDAP VACCINE Tdap vaccine can protect adolescents and adults from tetanus, diphtheria, and pertussis. One dose of Tdap is routinely given at age 11 or 12. People who did not get Tdap at that age should get it as soon as possible. Tdap is especially important for health care professionals and anyone having close contact with a baby younger than 12 months. Pregnant women should get a dose of Tdap during every pregnancy, to protect the newborn from pertussis. Infants are most at risk for severe, life-threatening  complications from pertussis. A similar vaccine, called Td, protects from tetanus and diphtheria, but not pertussis. A Td booster should be given every 10 years. Tdap may be given as one of these boosters if you have not already gotten a dose. Tdap may also be given after a severe cut or burn to prevent tetanus infection. Your doctor can give you more information. Tdap may safely be given at the same time as other vaccines. SOME PEOPLE SHOULD NOT GET THIS VACCINE  If you ever had a life-threatening allergic reaction after a dose of any tetanus, diphtheria, or pertussis containing vaccine, OR if you have a severe allergy to any part of this vaccine, you should not get Tdap. Tell your doctor if you have any severe allergies.  If you had a coma, or long or multiple seizures within 7 days after a childhood dose of DTP or DTaP, you should not get Tdap, unless a cause other than the vaccine was found. You can still get Td.  Talk to your doctor if you:  have epilepsy or another nervous system problem,  had severe pain or swelling after any vaccine containing diphtheria, tetanus or pertussis,  ever had Guillain-Barr Syndrome (GBS),  aren't feeling well on the day the shot is scheduled. RISKS OF A VACCINE REACTION With any medicine, including vaccines, there is a chance of side effects. These are usually mild and go away on their own, but serious reactions are also possible. Brief fainting spells can follow a vaccination, leading to injuries from falling. Sitting or lying down for about 15 minutes can help prevent these. Tell your doctor if you feel dizzy or light-headed, or   have vision changes or ringing in the ears. Mild problems following Tdap (Did not interfere with activities)  Pain where the shot was given (about 3 in 4 adolescents or 2 in 3 adults)  Redness or swelling where the shot was given (about 1 person in 5)  Mild fever of at least 100.31F (up to about 1 in 25 adolescents or 1 in  100 adults)  Headache (about 3 or 4 people in 10)  Tiredness (about 1 person in 3 or 4)  Nausea, vomiting, diarrhea, stomach ache (up to 1 in 4 adolescents or 1 in 10 adults)  Chills, body aches, sore joints, rash, swollen glands (uncommon) Moderate problems following Tdap (Interfered with activities, but did not require medical attention)  Pain where the shot was given (about 1 in 5 adolescents or 1 in 100 adults)  Redness or swelling where the shot was given (up to about 1 in 16 adolescents or 1 in 25 adults)  Fever over 102F (about 1 in 100 adolescents or 1 in 250 adults)  Headache (about 3 in 20 adolescents or 1 in 10 adults)  Nausea, vomiting, diarrhea, stomach ache (up to 1 or 3 people in 100)  Swelling of the entire arm where the shot was given (up to about 3 in 100). Severe problems following Tdap (Unable to perform usual activities, required medical attention)  Swelling, severe pain, bleeding and redness in the arm where the shot was given (rare). A severe allergic reaction could occur after any vaccine (estimated less than 1 in a million doses). WHAT IF THERE IS A SERIOUS REACTION? What should I look for?  Look for anything that concerns you, such as signs of a severe allergic reaction, very high fever, or behavior changes. Signs of a severe allergic reaction can include hives, swelling of the face and throat, difficulty breathing, a fast heartbeat, dizziness, and weakness. These would start a few minutes to a few hours after the vaccination. What should I do?  If you think it is a severe allergic reaction or other emergency that can't wait, call 9-1-1 or get the person to the nearest hospital. Otherwise, call your doctor.  Afterward, the reaction should be reported to the "Vaccine Adverse Event Reporting System" (VAERS). Your doctor might file this report, or you can do it yourself through the VAERS web site at www.vaers.LAgents.no, or by calling 1-(226) 265-6479. VAERS is  only for reporting reactions. They do not give medical advice.  THE NATIONAL VACCINE INJURY COMPENSATION PROGRAM The National Vaccine Injury Compensation Program (VICP) is a federal program that was created to compensate people who may have been injured by certain vaccines. Persons who believe they may have been injured by a vaccine can learn about the program and about filing a claim by calling 1-(859) 099-2766 or visiting the VICP website at SpiritualWord.at. HOW CAN I LEARN MORE?  Ask your doctor.  Call your local or state health department.  Contact the Centers for Disease Control and Prevention (CDC):  Call (718)406-1840 or visit CDC's website at PicCapture.uy CDC Tdap Vaccine VIS (12/05/11) Document Released: 01/14/2012 Document Reviewed: 01/14/2012 [facility] Patient Information 2013 [place], [place].  TB (Tuberculosis) Tine Test The tuberculin tine test is used to determine whether someone has come in contact with the bacteria that cause a disease called tuberculosis. HOW THE TEST IS DONE  This test uses a tiny spiked instrument to inject a small amount of the tuberculosis dead protein material (antigen) just under your skin. This is most commonly done on the  arm. Usually, the area is marked with an ink pen. That way it can be checked for any redness and swelling. It is usually checked in 2 to 3 days. Note: Another test, called the "tuberculin skin test" (also called a PPD), is more accurate than the TB tine test. It is the preferred method of determining exposure to tuberculosis. PREPARATION FOR TEST  There is no special preparation. People with a skin rash or other skin irritations on their arms may need to have the test performed at a different spot on the body. HOW THE TEST WILL FEEL  Some people feel a slight stinging sensation when the instrument is inserted under the skin. After the test, the area may itch or burn. MEANING OF THE TEST  This test  helps determine if you have ever been exposed to a disease called tuberculosis. If so, your immune system produced antibodies to help fight the disease. These remain in your body. When this test is performed, those with antibodies to tuberculosis will have a positive test result. OBTAINING THE TESTRESULTS The area needs to be checked for any redness and swelling at a later time, usually in 2 to 3 days. Follow your caregiver's instructions as to where and when to report for this to be done. It is your responsibility to obtain your test results. Ask the lab or department performing the test when and how you will get your results. NORMAL FINDINGS  If you have a negative test result, the area may be a little red, but will not be swollen and firm like a mosquito bite. This means you have not been exposed to the bacteria that cause tuberculosis. WHAT ABNORMAL RESULTS MEAN  If you have been exposed to tuberculosis, the area will become red and swell like a mosquito bit in 48 to 72 hours. This is considered a positive test result. It means your body's immune system detected the substance injected under your skin. A positive TB tine test does not mean that you have active tuberculosis. It only means that you have been exposed at some point in the past.  A chest x-ray may be taken to evaluate whether you have active tuberculosis. Once you have been exposed, all future TB tine tests will be positive. If you have a positive TB tine test, the Centers for Disease Control and Prevention (CDC) recommends that you have a TB skin test (PPD, see above), unless the first tine test had a blistering or very large reaction. RISKSAND COMPLICATIONS  The risk of severe side effects is very low. In the unlikely event that a side effect occurs, typical ones include itching and hives. Rarely, the area may blister, or the area of swelling may become very large. Tell your health care provider if you have any severe  reactions. CONSIDERATIONS  The test results may be incorrect (false negative). False negative means the test suggests you have not been exposed to tuberculosis, but you really have been. This is more likely in the elderly and in patients with weakened immune systems, such as:  AIDS patients  Cancer patients who receive chemotherapy  Those who receive organ transplants  Anyone taking high doses of prescription steroid medication Document Released: 12/30/2006 Document Revised: 10/07/2011 Document Reviewed: 01/26/2007 [facility] Patient Information 2013 [place], [place].  Bacterial Vaginosis Bacterial vaginosis (BV) is a vaginal infection where the normal balance of bacteria in the vagina is disrupted. The normal balance is then replaced by an overgrowth of certain bacteria. There are several different kinds  of bacteria that can cause BV. BV is the most common vaginal infection in women of childbearing age. CAUSES   The cause of BV is not fully understood. BV develops when there is an increase or imbalance of harmful bacteria.  Some activities or behaviors can upset the normal balance of bacteria in the vagina and put women at increased risk including:  Having a new sex partner or multiple sex partners.  Douching.  Using an intrauterine device (IUD) for contraception.  It is not clear what role sexual activity plays in the development of BV. However, women that have never had sexual intercourse are rarely infected with BV. Women do not get BV from toilet seats, bedding, swimming pools or from touching objects around them.  SYMPTOMS   Grey vaginal discharge.  A fish-like odor with discharge, especially after sexual intercourse.  Itching or burning of the vagina and vulva.  Burning or pain with urination.  Some women have no signs or symptoms at all. DIAGNOSIS  Your caregiver must examine the vagina for signs of BV. Your caregiver will perform lab tests and look at the sample of  vaginal fluid through a microscope. They will look for bacteria and abnormal cells (clue cells), a pH test higher than 4.5, and a positive amine test all associated with BV.  RISKS AND COMPLICATIONS   Pelvic inflammatory disease (PID).  Infections following gynecology surgery.  Developing HIV.  Developing herpes virus. TREATMENT  Sometimes BV will clear up without treatment. However, all women with symptoms of BV should be treated to avoid complications, especially if gynecology surgery is planned. Female partners generally do not need to be treated. However, BV may spread between female sex partners so treatment is helpful in preventing a recurrence of BV.   BV may be treated with antibiotics. The antibiotics come in either pill or vaginal cream forms. Either can be used with nonpregnant or pregnant women, but the recommended dosages differ. These antibiotics are not harmful to the baby.  BV can recur after treatment. If this happens, a second round of antibiotics will often be prescribed.  Treatment is important for pregnant women. If not treated, BV can cause a premature delivery, especially for a pregnant woman who had a premature birth in the past. All pregnant women who have symptoms of BV should be checked and treated.  For chronic reoccurrence of BV, treatment with a type of prescribed gel vaginally twice a week is helpful. HOME CARE INSTRUCTIONS   Finish all medication as directed by your caregiver.  Do not have sex until treatment is completed.  Tell your sexual partner that you have a vaginal infection. They should see their caregiver and be treated if they have problems, such as a mild rash or itching.  Practice safe sex. Use condoms. Only have 1 sex partner. PREVENTION  Basic prevention steps can help reduce the risk of upsetting the natural balance of bacteria in the vagina and developing BV:  Do not have sexual intercourse (be abstinent).  Do not douche.  Use all of  the medicine prescribed for treatment of BV, even if the signs and symptoms go away.  Tell your sex partner if you have BV. That way, they can be treated, if needed, to prevent reoccurrence. SEEK MEDICAL CARE IF:   Your symptoms are not improving after 3 days of treatment.  You have increased discharge, pain, or fever. MAKE SURE YOU:   Understand these instructions.  Will watch your condition.  Will get help  right away if you are not doing well or get worse. FOR MORE INFORMATION  Division of STD Prevention (DSTDP), Centers for Disease Control and Prevention: SolutionApps.co.za American Social Health Association (ASHA): www.ashastd.org  Document Released: 07/15/2005 Document Revised: 10/07/2011 Document Reviewed: 01/05/2009 [facility] Patient Information 2013 [place], [place].

## 2012-10-28 NOTE — Progress Notes (Addendum)
Sara Moran Mar El-Jamal 1986/07/23 [id number]   History:    28 y.o.  for annual gyn exam who is been complaining of a two-week history of nonproductive cough since she came back for the [place]. She denied any fever chills nausea or vomiting. Patient had been on the Yaz oral contraceptive pills but discontinued in December 2013 and would like to return on them and wanted her refill. She is contemplating of being pregnant next year. Patient with past history of normal Pap smears. Patient in frequently does her breast exam.  Patient has completed the Gardasil Vaccine series in 2008  Past medical history,surgical history, family history and social history were all reviewed and documented in the EPIC chart.  Gynecologic History Patient's last menstrual period was 10/20/2012. Contraception: none Last Pap: 2012. Results were: normal Last mammogram: not indicated. Results were: not indicated  Obstetric History OB History   Grav Para Term Preterm Abortions TAB SAB Ect Mult Living                   ROS: A ROS was performed and pertinent positives and negatives are included in the history.  GENERAL: No fevers or chills. HEENT: No change in vision, no earache, sore throat or sinus congestion. NECK: No pain or stiffness. CARDIOVASCULAR: No chest pain or pressure. No palpitations. PULMONARY: nonproductive cough GASTROINTESTINAL: No abdominal pain, nausea, vomiting or diarrhea, melena or bright red blood per rectum. GENITOURINARY: No urinary frequency, urgency, hesitancy or dysuria. MUSCULOSKELETAL: No joint or muscle pain, no back pain, no recent trauma. DERMATOLOGIC: No rash, no itching, no lesions. ENDOCRINE: No polyuria, polydipsia, no heat or cold intolerance. No recent change in weight. HEMATOLOGICAL: No anemia or easy bruising or bleeding. NEUROLOGIC: No headache, seizures, numbness, tingling or weakness. PSYCHIATRIC: No depression, no loss of interest in normal activity or change in sleep pattern.      Exam: chaperone present and BP 112/70  Ht 5' 5.75" (1.67 m)  Wt 145 lb (65.772 kg)  BMI 23.58 kg/m2  LMP 10/20/2012  Body mass index is 23.58 kg/(m^2).  General appearance : Well developed well nourished female. No acute distress HEENT: Neck supple, trachea midline, no carotid bruits, no thyroidmegaly Lungs: Clear to auscultation, no rhonchi or wheezes, or rib retractions  Heart: Regular rate and rhythm, no murmurs or gallops Breast:Examined in sitting and supine position were symmetrical in appearance, no palpable masses or tenderness,  no skin retraction, no nipple inversion, no nipple discharge, no skin discoloration, no axillary or supraclavicular lymphadenopathy Abdomen: no palpable masses or tenderness, no rebound or guarding Extremities: no edema or skin discoloration or tenderness  Pelvic:  Bartholin, Urethra, Skene Glands: Within normal limits             Vagina: No gross lesions , thick white discharge noted Cervix: No gross lesions or discharge  Uterus  anteverted, normal size, shape and consistency, non-tender and mobile  Adnexa  Without masses or tenderness  Anus and perineum  normal   Rectovaginal  normal sphincter tone without palpated masses or tenderness             Hemoccult not indicated   Wet prep: Close cells were noted along with tumors to count white blood cells and bacteria  Assessment/Plan:  27 y.o. female for annual exam with 2 weeks history of nonproductive cough. Patient is a world traveler. I am going to send her to the health department for TB tine test as well as to discuss with the health  department about the different vaccine since she should receive since she travels frequently around the world. She was given a prescription for Tussionex 12 D. To take 1 tablet every 12 hours along with Mucinex when necessary. She will be sent to the hospital for a chest x-ray PA and lateral. The following labs were ordered: Fasting blood sugar, fasting lipid  profile, TSH, CBC, as well as urinalysis. Patient was having slight vaginal discharge and a wet prep demonstrated she has bacterial vaginosis for which she will be prescribed Flagyl 500 mg one by mouth twice a day for 5 days.she was given literature information on Tdap vaccine, as well as on the TB tine test, and on breast examination. Prescription refill for Dianah Field was provided. We did discuss that there is a slight greater risk of DVT in patients on this oral contraceptive pill in comparison to others. I also recommended that on her long flights overseas that she get up and walk around the cabin several times to minimize her risk of a DVT. Pap smear was done today since she has been married now for 2 years since her last Pap smear and then we will adhere to the new guidelines.    Ok Edwards MD, 9:44 AM 10/28/2012

## 2012-10-28 NOTE — Telephone Encounter (Signed)
Patient informed chest x-ray negative.  She said she is at [facility] and has been told they do not have any TB test and do not know when they will.  I called Urgent Care and they said they do not have it either. They said there is a Sport and exercise psychologist of the test.  I called and left message with Infection Prevention at [facility] and asked them to call me back about this.  I called patient back and told her I was researching it and would let her know.

## 2012-10-29 LAB — URINALYSIS W MICROSCOPIC + REFLEX CULTURE
Bacteria, UA: NONE SEEN
Bilirubin Urine: NEGATIVE
Ketones, ur: NEGATIVE mg/dL
Protein, ur: NEGATIVE mg/dL
Squamous Epithelial / LPF: NONE SEEN
Urobilinogen, UA: 0.2 mg/dL (ref 0.0–1.0)

## 2013-01-21 ENCOUNTER — Ambulatory Visit: Payer: BC Managed Care – PPO | Admitting: Gynecology

## 2013-10-02 IMAGING — CR DG CHEST 2V
2 series · 2 of 2 positions shown · non-contrast
Comparison: 04/19/2010

CLINICAL DATA: Cough for 2 weeks, chest and head congestion

CHEST - 2 VIEW

[view not recorded (1 of 2)]
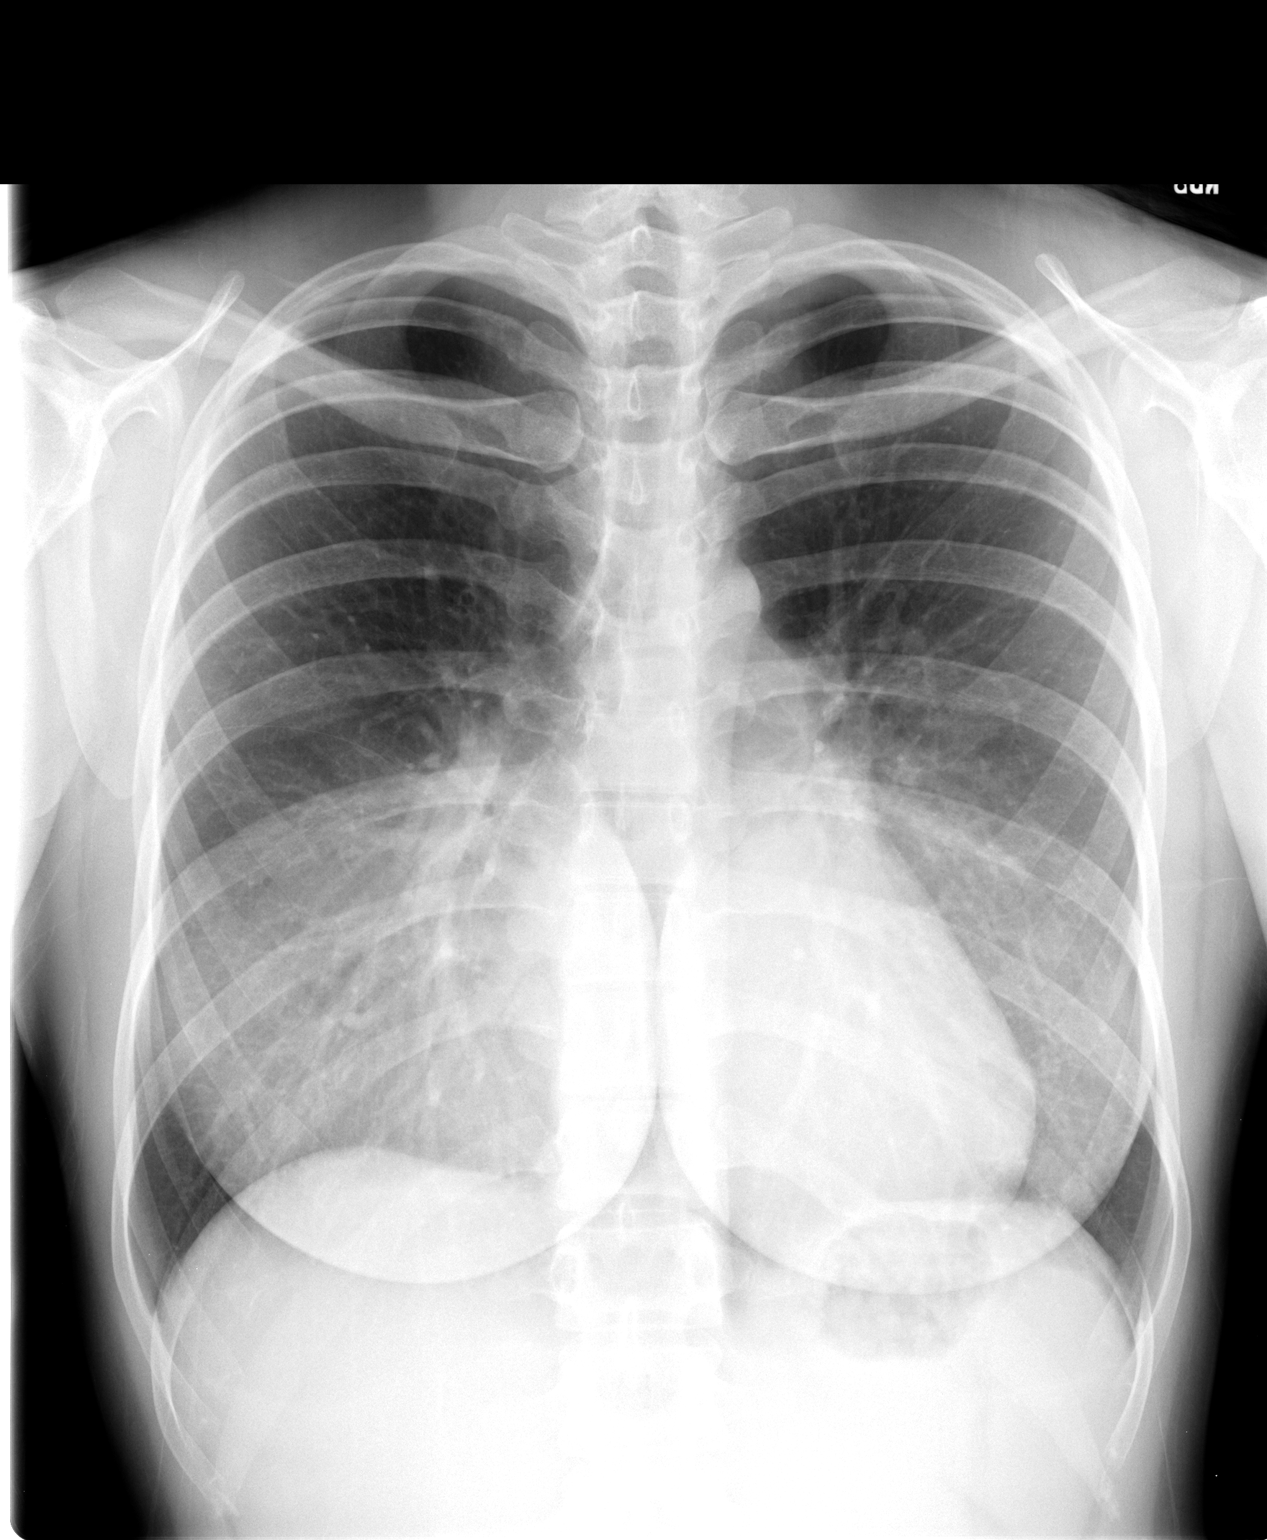

[view not recorded (2 of 2)]
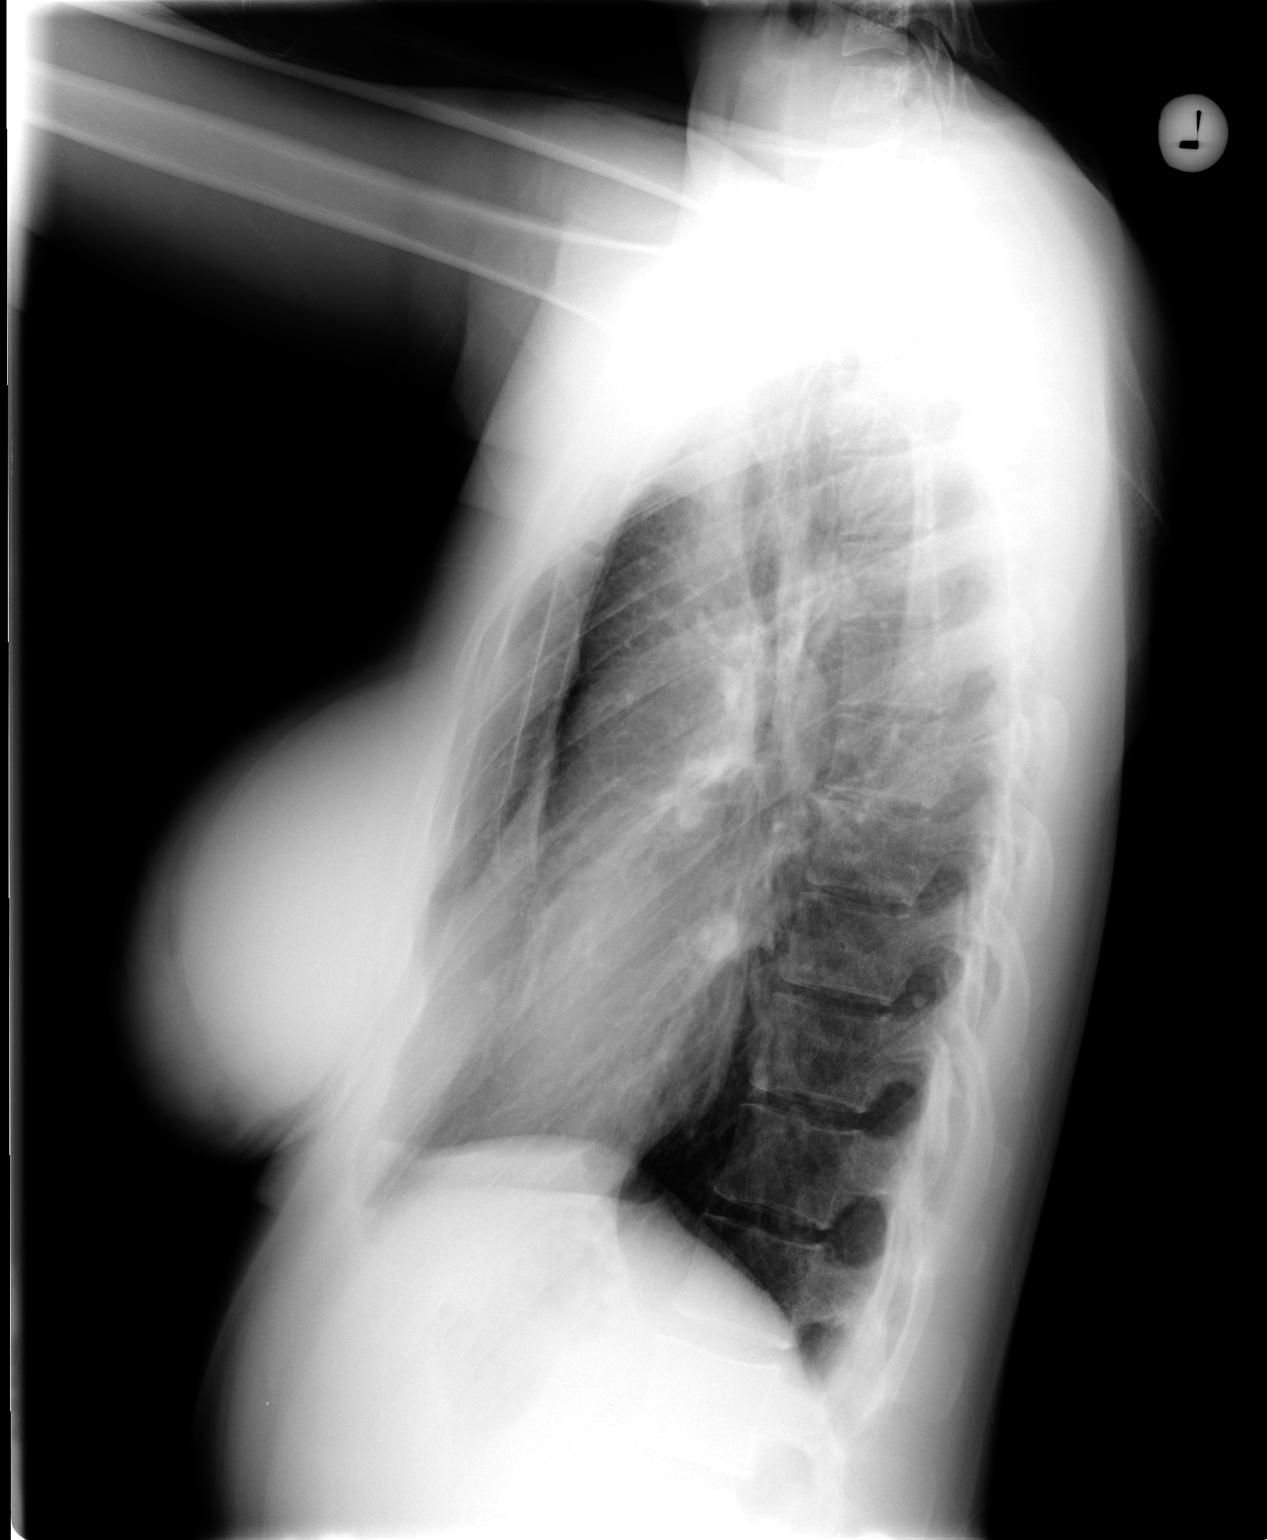

[2 of 2 positions shown; findings below may reference images not displayed]

FINDINGS: Normal heart size, mediastinal contours, and pulmonary vascularity.
Pectus excavatum.
Lungs clear.
No pleural effusion or pneumothorax.
Bilateral breast prostheses.
Osseous otherwise structures unremarkable
IMPRESSION: No acute abnormalities.

## 2015-12-21 ENCOUNTER — Telehealth: Payer: Self-pay | Admitting: Internal Medicine

## 2015-12-21 ENCOUNTER — Encounter: Payer: Self-pay | Admitting: Medical

## 2015-12-21 DIAGNOSIS — Z0289 Encounter for other administrative examinations: Secondary | ICD-10-CM

## 2015-12-21 NOTE — Telephone Encounter (Signed)
charge 

## 2015-12-21 NOTE — Progress Notes (Signed)
This encounter was created in error - please disregard.

## 2015-12-21 NOTE — Telephone Encounter (Signed)
Pt left VM 5/25 7:19am that something came up and would not be here for appt this morning 8:30am. Charge or no charge?

## 2015-12-22 ENCOUNTER — Encounter: Payer: Self-pay | Admitting: Medical

## 2015-12-22 NOTE — Telephone Encounter (Signed)
Marked to charge and mailing no show letter °

## 2016-03-27 NOTE — Telephone Encounter (Signed)
Pt called in she feels that she cancelled her appt as soon as she was able to. Pt says that she wasn't feeling well the morning of appt and wasn't able to come in. Pt disagrees with charge and states that she is not going to pay 50.00 no show fee. Informed pt that I would send a message to PCP to make him aware.    [phone]

## 2016-03-27 NOTE — Telephone Encounter (Signed)
Can drop the charge. Just remind on policy.

## 2016-06-03 ENCOUNTER — Encounter: Payer: Self-pay | Admitting: Gynecology

## 2016-06-18 ENCOUNTER — Telehealth: Payer: Self-pay | Admitting: Medical

## 2016-06-18 ENCOUNTER — Ambulatory Visit (INDEPENDENT_AMBULATORY_CARE_PROVIDER_SITE_OTHER): Payer: 59 | Admitting: Gynecology

## 2016-06-18 ENCOUNTER — Encounter: Payer: Self-pay | Admitting: Gynecology

## 2016-06-18 VITALS — BP 116/78 | Ht 65.75 in | Wt 135.0 lb

## 2016-06-18 DIAGNOSIS — R238 Other skin changes: Secondary | ICD-10-CM | POA: Diagnosis not present

## 2016-06-18 DIAGNOSIS — Z23 Encounter for immunization: Secondary | ICD-10-CM

## 2016-06-18 DIAGNOSIS — Z01411 Encounter for gynecological examination (general) (routine) with abnormal findings: Secondary | ICD-10-CM | POA: Diagnosis not present

## 2016-06-18 DIAGNOSIS — R233 Spontaneous ecchymoses: Secondary | ICD-10-CM | POA: Insufficient documentation

## 2016-06-18 DIAGNOSIS — Z1151 Encounter for screening for human papillomavirus (HPV): Secondary | ICD-10-CM | POA: Diagnosis not present

## 2016-06-18 NOTE — Progress Notes (Signed)
Sara KelchMaria Del Mar Moran 10/08/1985 [id number]   History:    30 y.o.  for annual gyn exam who has not been seen the office in 3 years [place]. Over year and a half she had a normal spontaneous vaginal delivery and is contemplating getting pregnant this January. She's currently not using any form of contraception reports normal menstrual cycles. She is requesting a flu vaccine. Patient with no previous history of any abnormal Pap smears.  Past medical history,surgical history, family history and social history were all reviewed and documented in the EPIC chart.  Gynecologic History Patient's last menstrual period was 05/26/2016. Contraception: none Last Pap: Over 4 years ago. Results were: normal Last mammogram: Not indicated. Results were: Not indicated  Obstetric History OB History  Gravida Para Term Preterm AB Living  1 1       1   SAB TAB Ectopic Multiple Live Births               # Outcome Date GA Lbr Len/2nd Weight Sex Delivery Anes PTL Lv  1 Para     F Vag-Spont          ROS: A ROS was performed and pertinent positives and negatives are included in the history.  GENERAL: No fevers or chills. HEENT: No change in vision, no earache, sore throat or sinus congestion. NECK: No pain or stiffness. CARDIOVASCULAR: No chest pain or pressure. No palpitations. PULMONARY: No shortness of breath, cough or wheeze. GASTROINTESTINAL: No abdominal pain, nausea, vomiting or diarrhea, melena or bright red blood per rectum. GENITOURINARY: No urinary frequency, urgency, hesitancy or dysuria. MUSCULOSKELETAL: No joint or muscle pain, no back pain, no recent trauma. DERMATOLOGIC: No rash, no itching, no lesions. ENDOCRINE: No polyuria, polydipsia, no heat or cold intolerance. No recent change in weight. HEMATOLOGICAL: No anemia or easy bruising or bleeding. NEUROLOGIC: No headache, seizures, numbness, tingling or weakness. PSYCHIATRIC: No depression, no loss of interest in  normal activity or change in sleep pattern.     Exam: chaperone present  BP 116/78   Ht 5' 5.75" (1.67 m)   Wt 135 lb (61.2 kg)   LMP 05/26/2016   BMI 21.96 kg/m   Body mass index is 21.96 kg/m.  General appearance : Well developed well nourished female. No acute distress HEENT: Eyes: no retinal hemorrhage or exudates,  Neck supple, trachea midline, no carotid bruits, no thyroidmegaly Lungs: Clear to auscultation, no rhonchi or wheezes, or rib retractions  Heart: Regular rate and rhythm, no murmurs or gallops Breast:Examined in sitting and supine position were symmetrical in appearance, no palpable masses or tenderness,  no skin retraction, no nipple inversion, no nipple discharge, no skin discoloration, no axillary or supraclavicular lymphadenopathy Abdomen: no palpable masses or tenderness, no rebound or guarding Extremities: no edema or skin discoloration or tenderness  Pelvic:  Bartholin, Urethra, Skene Glands: Within normal limits             Vagina: No gross lesions or discharge  Cervix: No gross lesions or discharge  Uterus  anteverted, normal size, shape and consistency, non-tender and mobile  Adnexa  Without masses or tenderness  Anus and perineum  normal   Rectovaginal  normal sphincter tone without palpated masses or tenderness             Hemoccult not indicated     Assessment/Plan:  30 y.o. female for annual exam will return to the office in a couple weeks in a fasting  state for the following screening blood work: Fasting lipid profile, comprehensive metabolic panel, TSH, CBC, and urinalysis. Pap smear with HPV screening done today. Patient received the flu vaccine after she was counseled.   Ok EdwardsFERNANDEZ,Braidon Chermak H MD, 11:30 AM 06/18/2016

## 2016-06-18 NOTE — Patient Instructions (Signed)

## 2016-06-18 NOTE — Addendum Note (Signed)
Addended by: Berna SpareASTILLO, Ashanti Littles A on: 06/18/2016 11:40 AM   Modules accepted: Orders

## 2016-06-18 NOTE — Telephone Encounter (Signed)
Pt had a pap today. The gyn who did study appears to not have abstracted the study yet. Will you go ahead and abstract the pap. Loraine LericheMark that it was done. Result will get  placed later.

## 2016-06-19 LAB — URINALYSIS W MICROSCOPIC + REFLEX CULTURE
BACTERIA UA: NONE SEEN [HPF]
BILIRUBIN URINE: NEGATIVE
CRYSTALS: NONE SEEN [HPF]
Casts: NONE SEEN [LPF]
GLUCOSE, UA: NEGATIVE
HGB URINE DIPSTICK: NEGATIVE
Ketones, ur: NEGATIVE
LEUKOCYTES UA: NEGATIVE
Nitrite: NEGATIVE
PROTEIN: NEGATIVE
RBC / HPF: NONE SEEN RBC/HPF (ref <=2)
Specific Gravity, Urine: 1.022 (ref 1.001–1.035)
WBC UA: NONE SEEN WBC/HPF (ref <=5)
Yeast: NONE SEEN [HPF]
pH: 5.5 (ref 5.0–8.0)

## 2016-06-24 LAB — PAP, TP IMAGING W/ HPV RNA, RFLX HPV TYPE 16,18/45: HPV MRNA, HIGH RISK: NOT DETECTED

## 2016-07-05 ENCOUNTER — Other Ambulatory Visit: Payer: Managed Care, Other (non HMO)

## 2016-07-05 LAB — COMPREHENSIVE METABOLIC PANEL
ALBUMIN: 4.2 g/dL (ref 3.6–5.1)
ALT: 10 U/L (ref 6–29)
AST: 17 U/L (ref 10–30)
Alkaline Phosphatase: 60 U/L (ref 33–115)
BILIRUBIN TOTAL: 1 mg/dL (ref 0.2–1.2)
BUN: 9 mg/dL (ref 7–25)
CO2: 22 mmol/L (ref 20–31)
CREATININE: 0.81 mg/dL (ref 0.50–1.10)
Calcium: 9.4 mg/dL (ref 8.6–10.2)
Chloride: 104 mmol/L (ref 98–110)
Glucose, Bld: 76 mg/dL (ref 65–99)
Potassium: 4.3 mmol/L (ref 3.5–5.3)
SODIUM: 141 mmol/L (ref 135–146)
TOTAL PROTEIN: 7.4 g/dL (ref 6.1–8.1)

## 2016-07-05 LAB — CBC WITH DIFFERENTIAL/PLATELET
BASOS ABS: 0 {cells}/uL (ref 0–200)
BASOS PCT: 0 %
EOS PCT: 6 %
Eosinophils Absolute: 396 cells/uL (ref 15–500)
HCT: 39.7 % (ref 35.0–45.0)
HEMOGLOBIN: 13.4 g/dL (ref 11.7–15.5)
LYMPHS ABS: 1716 {cells}/uL (ref 850–3900)
Lymphocytes Relative: 26 %
MCH: 30 pg (ref 27.0–33.0)
MCHC: 33.8 g/dL (ref 32.0–36.0)
MCV: 88.8 fL (ref 80.0–100.0)
MONOS PCT: 5 %
MPV: 10.5 fL (ref 7.5–12.5)
Monocytes Absolute: 330 cells/uL (ref 200–950)
NEUTROS ABS: 4158 {cells}/uL (ref 1500–7800)
Neutrophils Relative %: 63 %
PLATELETS: 206 10*3/uL (ref 140–400)
RBC: 4.47 MIL/uL (ref 3.80–5.10)
RDW: 12.5 % (ref 11.0–15.0)
WBC: 6.6 10*3/uL (ref 3.8–10.8)

## 2016-07-05 LAB — LIPID PANEL
CHOLESTEROL: 174 mg/dL (ref <200)
HDL: 52 mg/dL (ref >50)
LDL Cholesterol: 111 mg/dL — ABNORMAL HIGH (ref <100)
TRIGLYCERIDES: 54 mg/dL (ref <150)
Total CHOL/HDL Ratio: 3.3 Ratio (ref <5.0)
VLDL: 11 mg/dL (ref <30)

## 2016-07-05 LAB — TSH: TSH: 1.85 mIU/L

## 2016-12-11 ENCOUNTER — Encounter: Payer: Self-pay | Admitting: Gynecology

## 2017-01-06 ENCOUNTER — Ambulatory Visit (INDEPENDENT_AMBULATORY_CARE_PROVIDER_SITE_OTHER): Payer: 59 | Admitting: Gynecology

## 2017-01-06 ENCOUNTER — Encounter: Payer: Self-pay | Admitting: Gynecology

## 2017-01-06 VITALS — BP 122/78

## 2017-01-06 DIAGNOSIS — Z3201 Encounter for pregnancy test, result positive: Secondary | ICD-10-CM | POA: Diagnosis not present

## 2017-01-06 DIAGNOSIS — Z3491 Encounter for supervision of normal pregnancy, unspecified, first trimester: Secondary | ICD-10-CM

## 2017-01-06 DIAGNOSIS — N912 Amenorrhea, unspecified: Secondary | ICD-10-CM | POA: Diagnosis not present

## 2017-01-06 LAB — PREGNANCY, URINE: Preg Test, Ur: POSITIVE — AB

## 2017-01-06 NOTE — Patient Instructions (Signed)
First Trimester of Pregnancy The first trimester of pregnancy is from week 1 until the end of week 13 (months 1 through 3). A week after a sperm fertilizes an egg, the egg will implant on the wall of the uterus. This embryo will begin to develop into a baby. Genes from you and your partner will form the baby. The female genes will determine whether the baby will be a boy or a girl. At 6-8 weeks, the eyes and face will be formed, and the heartbeat can be seen on ultrasound. At the end of 12 weeks, all the baby's organs will be formed. Now that you are pregnant, you will want to do everything you can to have a healthy baby. Two of the most important things are to get good prenatal care and to follow your health care provider's instructions. Prenatal care is all the medical care you receive before the baby's birth. This care will help prevent, find, and treat any problems during the pregnancy and childbirth. Body changes during your first trimester Your body goes through many changes during pregnancy. The changes vary from woman to woman.  You may gain or lose a couple of pounds at first.  You may feel sick to your stomach (nauseous) and you may throw up (vomit). If the vomiting is uncontrollable, call your health care provider.  You may tire easily.  You may develop headaches that can be relieved by medicines. All medicines should be approved by your health care provider.  You may urinate more often. Painful urination may mean you have a bladder infection.  You may develop heartburn as a result of your pregnancy.  You may develop constipation because certain hormones are causing the muscles that push stool through your intestines to slow down.  You may develop hemorrhoids or swollen veins (varicose veins).  Your breasts may begin to grow larger and become tender. Your nipples may stick out more, and the tissue that surrounds them (areola) may become darker.  Your gums may bleed and may be  sensitive to brushing and flossing.  Dark spots or blotches (chloasma, mask of pregnancy) may develop on your face. This will likely fade after the baby is born.  Your menstrual periods will stop.  You may have a loss of appetite.  You may develop cravings for certain kinds of food.  You may have changes in your emotions from day to day, such as being excited to be pregnant or being concerned that something may go wrong with the pregnancy and baby.  You may have more vivid and strange dreams.  You may have changes in your hair. These can include thickening of your hair, rapid growth, and changes in texture. Some women also have hair loss during or after pregnancy, or hair that feels dry or thin. Your hair will most likely return to normal after your baby is born.  What to expect at prenatal visits During a routine prenatal visit:  You will be weighed to make sure you and the baby are growing normally.  Your blood pressure will be taken.  Your abdomen will be measured to track your baby's growth.  The fetal heartbeat will be listened to between weeks 10 and 14 of your pregnancy.  Test results from any previous visits will be discussed.  Your health care provider may ask you:  How you are feeling.  If you are feeling the baby move.  If you have had any abnormal symptoms, such as leaking fluid, bleeding, severe headaches,   or abdominal cramping.  If you are using any tobacco products, including cigarettes, chewing tobacco, and electronic cigarettes.  If you have any questions.  Other tests that may be performed during your first trimester include:  Blood tests to find your blood type and to check for the presence of any previous infections. The tests will also be used to check for low iron levels (anemia) and protein on red blood cells (Rh antibodies). Depending on your risk factors, or if you previously had diabetes during pregnancy, you may have tests to check for high blood  sugar that affects pregnant women (gestational diabetes).  Urine tests to check for infections, diabetes, or protein in the urine.  An ultrasound to confirm the proper growth and development of the baby.  Fetal screens for spinal cord problems (spina bifida) and Down syndrome.  HIV (human immunodeficiency virus) testing. Routine prenatal testing includes screening for HIV, unless you choose not to have this test.  You may need other tests to make sure you and the baby are doing well.  Follow these instructions at home: Medicines  Follow your health care provider's instructions regarding medicine use. Specific medicines may be either safe or unsafe to take during pregnancy.  Take a prenatal vitamin that contains at least 600 micrograms (mcg) of folic acid.  If you develop constipation, try taking a stool softener if your health care provider approves. Eating and drinking  Eat a balanced diet that includes fresh fruits and vegetables, whole grains, good sources of protein such as meat, eggs, or tofu, and low-fat dairy. Your health care provider will help you determine the amount of weight gain that is right for you.  Avoid raw meat and uncooked cheese. These carry germs that can cause birth defects in the baby.  Eating four or five small meals rather than three large meals a day may help relieve nausea and vomiting. If you start to feel nauseous, eating a few soda crackers can be helpful. Drinking liquids between meals, instead of during meals, also seems to help ease nausea and vomiting.  Limit foods that are high in fat and processed sugars, such as fried and sweet foods.  To prevent constipation: ? Eat foods that are high in fiber, such as fresh fruits and vegetables, whole grains, and beans. ? Drink enough fluid to keep your urine clear or pale yellow. Activity  Exercise only as directed by your health care provider. Most women can continue their usual exercise routine during  pregnancy. Try to exercise for 30 minutes at least 5 days a week. Exercising will help you: ? Control your weight. ? Stay in shape. ? Be prepared for labor and delivery.  Experiencing pain or cramping in the lower abdomen or lower back is a good sign that you should stop exercising. Check with your health care provider before continuing with normal exercises.  Try to avoid standing for long periods of time. Move your legs often if you must stand in one place for a long time.  Avoid heavy lifting.  Wear low-heeled shoes and practice good posture.  You may continue to have sex unless your health care provider tells you not to. Relieving pain and discomfort  Wear a good support bra to relieve breast tenderness.  Take warm sitz baths to soothe any pain or discomfort caused by hemorrhoids. Use hemorrhoid cream if your health care provider approves.  Rest with your legs elevated if you have leg cramps or low back pain.  If you develop   varicose veins in your legs, wear support hose. Elevate your feet for 15 minutes, 3-4 times a day. Limit salt in your diet. Prenatal care  Schedule your prenatal visits by the twelfth week of pregnancy. They are usually scheduled monthly at first, then more often in the last 2 months before delivery.  Write down your questions. Take them to your prenatal visits.  Keep all your prenatal visits as told by your health care provider. This is important. Safety  Wear your seat belt at all times when driving.  Make a list of emergency phone numbers, including numbers for family, friends, the hospital, and police and fire departments. General instructions  Ask your health care provider for a referral to a local prenatal education class. Begin classes no later than the beginning of month 6 of your pregnancy.  Ask for help if you have counseling or nutritional needs during pregnancy. Your health care provider can offer advice or refer you to specialists for help  with various needs.  Do not use hot tubs, steam rooms, or saunas.  Do not douche or use tampons or scented sanitary pads.  Do not cross your legs for long periods of time.  Avoid cat litter boxes and soil used by cats. These carry germs that can cause birth defects in the baby and possibly loss of the fetus by miscarriage or stillbirth.  Avoid all smoking, herbs, alcohol, and medicines not prescribed by your health care provider. Chemicals in these products affect the formation and growth of the baby.  Do not use any products that contain nicotine or tobacco, such as cigarettes and e-cigarettes. If you need help quitting, ask your health care provider. You may receive counseling support and other resources to help you quit.  Schedule a dentist appointment. At home, brush your teeth with a soft toothbrush and be gentle when you floss. Contact a health care provider if:  You have dizziness.  You have mild pelvic cramps, pelvic pressure, or nagging pain in the abdominal area.  You have persistent nausea, vomiting, or diarrhea.  You have a bad smelling vaginal discharge.  You have pain when you urinate.  You notice increased swelling in your face, hands, legs, or ankles.  You are exposed to fifth disease or chickenpox.  You are exposed to German measles (rubella) and have never had it. Get help right away if:  You have a fever.  You are leaking fluid from your vagina.  You have spotting or bleeding from your vagina.  You have severe abdominal cramping or pain.  You have rapid weight gain or loss.  You vomit blood or material that looks like coffee grounds.  You develop a severe headache.  You have shortness of breath.  You have any kind of trauma, such as from a fall or a car accident. Summary  The first trimester of pregnancy is from week 1 until the end of week 13 (months 1 through 3).  Your body goes through many changes during pregnancy. The changes vary from  woman to woman.  You will have routine prenatal visits. During those visits, your health care provider will examine you, discuss any test results you may have, and talk with you about how you are feeling. This information is not intended to replace advice given to you by your health care provider. Make sure you discuss any questions you have with your health care provider. Document Released: 07/09/2001 Document Revised: 06/26/2016 Document Reviewed: 06/26/2016 [employer] Interactive Patient Education  2017 [employer]  

## 2017-01-06 NOTE — Progress Notes (Signed)
   Patient is a 31 year old now gravida 2 para 1 AB 0 who presented to the office today stating that she had a normal menstrual period 12/03/2016. She had a positive home pregnancy test. She denied any nausea, vomiting or any vaginal discharge or bleeding. Her first pregnancy was delivered at [redacted] weeks gestation vaginal with no complication expect for positive group B strep. She is currently on her prenatal vitamins.  Exam: Abdomen: Soft nontender no rebound or guarding Pelvic: Bartholin urethra Skene was within normal limits Vagina: No lesions or discharge Cervix: No gross lesions on inspection Uterus slightly retroverted 4-6 weeks size Adnexa no palpable masses or tenderness rectal exam not done  Assessment/plan: First trimester pregnancy based on last menstrual period patient would be 4 6/7th week gestation with a due date of 09/12/2017. We'll obtain a quantitative beta-hCG today repeat at the end of the week with a follow-up ultrasound next week pending on results of blood test. Patient to continue her prenatal vitamins. First trimester instructions were provided.

## 2017-01-06 NOTE — Addendum Note (Signed)
Addended by: Esmeralda LinksHAYNES-LYMAN, Migel Hannis S on: 01/06/2017 10:44 AM   Modules accepted: Orders

## 2017-01-07 ENCOUNTER — Other Ambulatory Visit: Payer: Self-pay | Admitting: Gynecology

## 2017-01-07 DIAGNOSIS — Z369 Encounter for antenatal screening, unspecified: Secondary | ICD-10-CM

## 2017-01-07 LAB — HCG, QUANTITATIVE, PREGNANCY: hCG, Beta Chain, Quant, S: 426.4 m[IU]/mL — ABNORMAL HIGH

## 2017-01-10 ENCOUNTER — Other Ambulatory Visit: Payer: 59

## 2017-01-10 DIAGNOSIS — Z3491 Encounter for supervision of normal pregnancy, unspecified, first trimester: Secondary | ICD-10-CM

## 2017-01-11 LAB — HCG, QUANTITATIVE, PREGNANCY: HCG, BETA CHAIN, QUANT, S: 3199.8 m[IU]/mL — AB

## 2017-01-15 ENCOUNTER — Other Ambulatory Visit: Payer: Self-pay | Admitting: Gynecology

## 2017-01-15 ENCOUNTER — Ambulatory Visit (INDEPENDENT_AMBULATORY_CARE_PROVIDER_SITE_OTHER): Payer: 59

## 2017-01-15 ENCOUNTER — Encounter: Payer: Self-pay | Admitting: Gynecology

## 2017-01-15 ENCOUNTER — Ambulatory Visit (INDEPENDENT_AMBULATORY_CARE_PROVIDER_SITE_OTHER): Payer: 59 | Admitting: Gynecology

## 2017-01-15 VITALS — BP 110/78

## 2017-01-15 DIAGNOSIS — Z3491 Encounter for supervision of normal pregnancy, unspecified, first trimester: Secondary | ICD-10-CM

## 2017-01-15 DIAGNOSIS — Z3490 Encounter for supervision of normal pregnancy, unspecified, unspecified trimester: Secondary | ICD-10-CM | POA: Diagnosis not present

## 2017-01-15 DIAGNOSIS — N83201 Unspecified ovarian cyst, right side: Secondary | ICD-10-CM

## 2017-01-15 DIAGNOSIS — N920 Excessive and frequent menstruation with regular cycle: Secondary | ICD-10-CM | POA: Diagnosis not present

## 2017-01-15 NOTE — Patient Instructions (Signed)
Pregnancy and Zika Virus Disease Zika virus disease, or Zika, is an illness that can spread to people from mosquitoes that carry the virus. It may also spread from person to person through infected body fluids. Zika first occurred in [place], but recently it has spread to new areas. The virus occurs in tropical climates. The location of Zika continues to change. Most people who become infected with Zika virus do not develop serious illness. However, Zika may cause birth defects in an unborn baby whose mother is infected with the virus. It may also increase the risk of miscarriage. What are the symptoms of Zika virus disease? In many cases, people who have been infected with Zika virus do not develop any symptoms. If symptoms appear, they usually start about a week after the person is infected. Symptoms are usually mild. They may include:  Fever.  Rash.  Red eyes.  Joint pain.  How does Zika virus disease spread? The main way that Zika virus spreads is through the bite of a certain type of mosquito. Unlike most types of mosquitos, which bite only at night, the type of mosquito that carries Zika virus bites both at night and during the day. Zika virus can also spread through sexual contact, through a blood transfusion, and from a mother to her baby before or during birth. Once you have had Zika virus disease, it is unlikely that you will get it again. Can I pass Zika to my baby during pregnancy? Yes, Zika can pass from a mother to her baby before or during birth. What problems can Zika cause for my baby? A woman who is infected with Zika virus while pregnant is at risk of having her baby born with a condition in which the brain or head is smaller than expected (microcephaly). Babies who have microcephaly can have developmental delays, seizures, hearing problems, and vision problems. Having Zika virus disease during pregnancy can also increase the risk of miscarriage. How can Zika virus disease be  prevented? There is no vaccine to prevent Zika. The best way to prevent the disease is to avoid infected mosquitoes and avoid exposure to body fluids that can spread the virus. Avoid any possible exposure to Zika by taking the following precautions. For women and their sex partners:  Avoid traveling to high-risk areas. The locations where Zika is being reported change often. To identify high-risk areas, check the CDC travel website: www.cdc.gov/zika/geo/index.html  If you or your sex partner must travel to a high-risk area, talk with a health care provider before and after traveling.  Take all precautions to avoid mosquito bites if you live in, or travel to, any of the high-risk areas. Insect repellents are safe to use during pregnancy.  Ask your health care provider when it is safe to have sexual contact.  For women:  If you are pregnant or trying to become pregnant, avoid sexual contact with persons who may have been exposed to Zika virus, persons who have possible symptoms of Zika, or persons whose history you are unsure about. If you choose to have sexual contact with someone who may have been exposed to Zika virus, use condoms correctly during the entire duration of sexual activity, every time. Do not share sexual devices, as you may be exposed to body fluids.  Ask your health care provider about when it is safe to attempt pregnancy after a possible exposure to Zika virus.  What steps should I take to avoid mosquito bites? Take these steps to avoid mosquito bites   when you are in a high-risk area:  Wear loose clothing that covers your arms and legs.  Limit your outdoor activities.  Do not open windows unless they have window screens.  Sleep under mosquito nets.  Use insect repellent. The best insect repellents have:  DEET, picaridin, oil of lemon eucalyptus (OLE), or [id number] in them.  Higher amounts of an active ingredient in them.  Remember that insect repellents are safe to  use during pregnancy.  Do not use OLE on children who are younger than 963 years of age. Do not use insect repellent on babies who are younger than 112 months of age.  Cover your child's stroller with mosquito netting. Make sure the netting fits snugly and that any loose netting does not cover your child's mouth or nose. Do not use a blanket as a mosquito-protection cover.  Do not apply insect repellent underneath clothing.  If you are using sunscreen, apply the sunscreen before applying the insect repellent.  Treat clothing with permethrin. Do not apply permethrin directly to your skin. Follow label directions for safe use.  Get rid of standing water, where mosquitoes may reproduce. Standing water is often found in items such as buckets, bowls, animal food dishes, and flowerpots.  When you return from traveling to any high-risk area, continue taking actions to protect yourself against mosquito bites for 3 weeks, even if you show no signs of illness. This will prevent spreading Zika virus to uninfected mosquitoes. What should I know about the sexual transmission of Zika? People can spread Zika to their sexual partners during vaginal, anal, or oral sex, or by sharing sexual devices. Many people with [place] do not develop symptoms, so a person could spread the disease without knowing that they are infected. The greatest risk is to women who are pregnant or who may become pregnant. Zika virus can live longer in semen than it can live in blood. Couples can prevent sexual transmission of the virus by:  Using condoms correctly during the entire duration of sexual activity, every time. This includes vaginal, anal, and oral sex.  Not sharing sexual devices. Sharing increases your risk of being exposed to body fluid from another person.  Avoiding all sexual activity until your health care provider says it is safe.  Should I be tested for Zika virus? A sample of your blood can be tested for Zika virus. A  pregnant woman should be tested if she may have been exposed to the virus or if she has symptoms of Zika. She may also have additional tests done during her pregnancy, such ultrasound testing. Talk with your health care provider about which tests are recommended. This information is not intended to replace advice given to you by your health care provider. Make sure you discuss any questions you have with your health care provider. Document Released: 04/05/2015 Document Revised: 12/21/2015 Document Reviewed: 03/29/2015 Elsevier Interactive Patient Education  2018 [employer] Ovarian Cyst An ovarian cyst is a fluid-filled sac that forms on an ovary. The ovaries are small organs that produce eggs in women. Various types of cysts can form on the ovaries. Some may cause symptoms and require treatment. Most ovarian cysts go away on their own, are not cancerous (are benign), and do not cause problems. Common types of ovarian cysts include:  Functional (follicle) cysts. ? Occur during the menstrual cycle, and usually go away with the next menstrual cycle if you do not get pregnant. ? Usually cause no symptoms.  Endometriomas. ? Are cysts  that form from the tissue that lines the uterus (endometrium). ? Are sometimes called "chocolate cysts" because they become filled with blood that turns brown. ? Can cause pain in the lower abdomen during intercourse and during your period.  Cystadenoma cysts. ? Develop from cells on the outside surface of the ovary. ? Can get very large and cause lower abdomen pain and pain with intercourse. ? Can cause severe pain if they twist or break open (rupture).  Dermoid cysts. ? Are sometimes found in both ovaries. ? May contain different kinds of body tissue, such as skin, teeth, hair, or cartilage. ? Usually do not cause symptoms unless they get very big.  Theca lutein cysts. ? Occur when too much of a certain hormone (human chorionic gonadotropin) is produced and  overstimulates the ovaries to produce an egg. ? Are most common after having procedures used to assist with the conception of a baby (in vitro fertilization).  What are the causes? Ovarian cysts may be caused by:  Ovarian hyperstimulation syndrome. This is a condition that can develop from taking fertility medicines. It causes multiple large ovarian cysts to form.  Polycystic ovarian syndrome (PCOS). This is a common hormonal disorder that can cause ovarian cysts, as well as problems with your period or fertility.  What increases the risk? The following factors may make you more likely to develop ovarian cysts:  Being overweight or obese.  Taking fertility medicines.  Taking certain forms of hormonal birth control.  Smoking.  What are the signs or symptoms? Many ovarian cysts do not cause symptoms. If symptoms are present, they may include:  Pelvic pain or pressure.  Pain in the lower abdomen.  Pain during sex.  Abdominal swelling.  Abnormal menstrual periods.  Increasing pain with menstrual periods.  How is this diagnosed? These cysts are commonly found during a routine pelvic exam. You may have tests to find out more about the cyst, such as:  Ultrasound.  X-ray of the pelvis.  CT scan.  MRI.  Blood tests.  How is this treated? Many ovarian cysts go away on their own without treatment. Your health care provider may want to check your cyst regularly for 2-3 months to see if it changes. If you are in menopause, it is especially important to have your cyst monitored closely because menopausal women have a higher rate of ovarian cancer. When treatment is needed, it may include:  Medicines to help relieve pain.  A procedure to drain the cyst (aspiration).  Surgery to remove the whole cyst.  Hormone treatment or birth control pills. These methods are sometimes used to help dissolve a cyst.  Follow these instructions at home:  Take over-the-counter and  prescription medicines only as told by your health care provider.  Do not drive or use heavy machinery while taking prescription pain medicine.  Get regular pelvic exams and Pap tests as often as told by your health care provider.  Return to your normal activities as told by your health care provider. Ask your health care provider what activities are safe for you.  Do not use any products that contain nicotine or tobacco, such as cigarettes and e-cigarettes. If you need help quitting, ask your health care provider.  Keep all follow-up visits as told by your health care provider. This is important. Contact a health care provider if:  Your periods are late, irregular, or painful, or they stop.  You have pelvic pain that does not go away.  You have pressure on  your bladder or trouble emptying your bladder completely.  You have pain during sex.  You have any of the following in your abdomen: ? A feeling of fullness. ? Pressure. ? Discomfort. ? Pain that does not go away. ? Swelling.  You feel generally ill.  You become constipated.  You lose your appetite.  You develop severe acne.  You start to have more body hair and facial hair.  You are gaining weight or losing weight without changing your exercise and eating habits.  You think you may be pregnant. Get help right away if:  You have abdominal pain that is severe or gets worse.  You cannot eat or drink without vomiting.  You suddenly develop a fever.  Your menstrual period is much heavier than usual. This information is not intended to replace advice given to you by your health care provider. Make sure you discuss any questions you have with your health care provider. Document Released: 07/15/2005 Document Revised: 02/02/2016 Document Reviewed: 12/17/2015 Elsevier Interactive Patient Education  [employer].

## 2017-01-15 NOTE — Progress Notes (Signed)
   Patient is a 31 year old gravida 2 para 1 who was seen in the office on June 11 stating that her last menstrual cycle started on 12/03/2016 and had a positive home pregnancy test. The uterine patency test was confirmed in office and she had quantitative beta-hCG with the following result:  Results for Sara Moran, Sara Moran (MRN [id number]) as of 01/15/2017 11:23  Ref. Range 01/06/2017 10:44 01/10/2017 15:34  HCG, Beta Chain, Quant, S Latest Units: mIU/mL 426.4 (H) 3,199.8 (H)   Patient is a symptomatic today and is here to discuss the ultrasound. Based on last menstrual period patient would be 6 weeks and one day in based on ultrasound findings today she is 6 weeks with a due date of every 13 2019.  Intrauterine gestational sac consistent with 6 weeks no fetal pole no cardiac activity seen. Normal shape yoke sac. Early gestation. Right ovary thick walled cystic solid mass 53 x 47 x 44 mm. Left ovary was normal. No fluid in the cul-de-sac. Cervix is long closed  Assessment/plan: First trimester pregnancy right at [redacted] weeks gestation patient was instructed to return back in 2 weeks for follow-up for fetal viability although the gestational sac is normal size yoke sac is present she was reassured and along with a rising quantitative beta-hCG. She has decided to be seen with the new OB appointment at the new provider's office the next couple weeks with a we'll follow-up with the ultrasound as well as to follow-up on this ovarian cyst is probably just a hemorrhagic/corpus luteum cyst. She denied any vaginal bleeding is otherwise doing well on her prenatal vitamins. First trimester instructions were provided.

## 2017-01-24 DIAGNOSIS — Z6822 Body mass index (BMI) 22.0-22.9, adult: Secondary | ICD-10-CM | POA: Diagnosis not present

## 2017-01-24 DIAGNOSIS — Z3201 Encounter for pregnancy test, result positive: Secondary | ICD-10-CM | POA: Diagnosis not present

## 2017-02-07 DIAGNOSIS — Z3689 Encounter for other specified antenatal screening: Secondary | ICD-10-CM | POA: Diagnosis not present

## 2017-02-07 LAB — OB RESULTS CONSOLE RPR: RPR: NONREACTIVE

## 2017-02-07 LAB — OB RESULTS CONSOLE ABO/RH: RH Type: POSITIVE

## 2017-02-07 LAB — OB RESULTS CONSOLE HEPATITIS B SURFACE ANTIGEN: Hepatitis B Surface Ag: NEGATIVE

## 2017-02-07 LAB — OB RESULTS CONSOLE HIV ANTIBODY (ROUTINE TESTING): HIV: NONREACTIVE

## 2017-02-07 LAB — OB RESULTS CONSOLE ANTIBODY SCREEN: Antibody Screen: NEGATIVE

## 2017-02-07 LAB — OB RESULTS CONSOLE RUBELLA ANTIBODY, IGM: Rubella: IMMUNE

## 2017-02-17 DIAGNOSIS — Z3689 Encounter for other specified antenatal screening: Secondary | ICD-10-CM | POA: Diagnosis not present

## 2017-02-17 DIAGNOSIS — Z118 Encounter for screening for other infectious and parasitic diseases: Secondary | ICD-10-CM | POA: Diagnosis not present

## 2017-02-17 DIAGNOSIS — Z3481 Encounter for supervision of other normal pregnancy, first trimester: Secondary | ICD-10-CM | POA: Diagnosis not present

## 2017-02-17 LAB — OB RESULTS CONSOLE GC/CHLAMYDIA
CHLAMYDIA, DNA PROBE: NEGATIVE
GC PROBE AMP, GENITAL: NEGATIVE

## 2017-03-18 DIAGNOSIS — N83201 Unspecified ovarian cyst, right side: Secondary | ICD-10-CM | POA: Diagnosis not present

## 2017-03-18 DIAGNOSIS — Z3482 Encounter for supervision of other normal pregnancy, second trimester: Secondary | ICD-10-CM | POA: Diagnosis not present

## 2017-04-01 DIAGNOSIS — Z361 Encounter for antenatal screening for raised alphafetoprotein level: Secondary | ICD-10-CM | POA: Diagnosis not present

## 2017-04-21 DIAGNOSIS — Z363 Encounter for antenatal screening for malformations: Secondary | ICD-10-CM | POA: Diagnosis not present

## 2017-05-15 DIAGNOSIS — Z362 Encounter for other antenatal screening follow-up: Secondary | ICD-10-CM | POA: Diagnosis not present

## 2017-05-15 DIAGNOSIS — Z3482 Encounter for supervision of other normal pregnancy, second trimester: Secondary | ICD-10-CM | POA: Diagnosis not present

## 2017-05-15 DIAGNOSIS — Z23 Encounter for immunization: Secondary | ICD-10-CM | POA: Diagnosis not present

## 2017-07-09 DIAGNOSIS — Z23 Encounter for immunization: Secondary | ICD-10-CM | POA: Diagnosis not present

## 2017-07-29 NOTE — L&D Delivery Note (Signed)
Delivery Note At 8:07 PM a viable and healthy female was delivered via Vaginal, Spontaneous (Presentation:vtx ;OA  ).  APGAR:8 , 9; weight pending .   Placenta status: spontaneous, intact not sent, .  Cord:  with the following complications:none .  Cord pH: none  Anesthesia: epidural  Episiotomy: None Lacerations:  Left vaginal sulcus Suture Repair: 3.0 chromic Est. Blood Loss (mL):  250  Mom to postpartum.  Baby to Couplet care / Skin to Skin.  Ethin Drummond A Mallary Kreger 09/12/2017, 8:35 PM

## 2017-07-30 DIAGNOSIS — Z3A33 33 weeks gestation of pregnancy: Secondary | ICD-10-CM | POA: Diagnosis not present

## 2017-07-30 DIAGNOSIS — O4443 Low lying placenta NOS or without hemorrhage, third trimester: Secondary | ICD-10-CM | POA: Diagnosis not present

## 2017-08-13 DIAGNOSIS — Z3685 Encounter for antenatal screening for Streptococcus B: Secondary | ICD-10-CM | POA: Diagnosis not present

## 2017-08-13 LAB — OB RESULTS CONSOLE GBS: STREP GROUP B AG: NEGATIVE

## 2017-08-27 DIAGNOSIS — Z3A37 37 weeks gestation of pregnancy: Secondary | ICD-10-CM | POA: Diagnosis not present

## 2017-08-27 DIAGNOSIS — O368130 Decreased fetal movements, third trimester, not applicable or unspecified: Secondary | ICD-10-CM | POA: Diagnosis not present

## 2017-09-11 ENCOUNTER — Inpatient Hospital Stay (HOSPITAL_COMMUNITY)
Admission: AD | Admit: 2017-09-11 | Discharge: 2017-09-11 | Disposition: A | Discharge status: Home / Self Care | Payer: 59 | Source: Ambulatory Visit | Attending: Obstetrics and Gynecology | Admitting: Obstetrics and Gynecology

## 2017-09-11 ENCOUNTER — Encounter (HOSPITAL_COMMUNITY): Payer: Self-pay

## 2017-09-11 DIAGNOSIS — O471 False labor at or after 37 completed weeks of gestation: Secondary | ICD-10-CM

## 2017-09-11 NOTE — Discharge Instructions (Signed)
Labor precaution, SROM

## 2017-09-11 NOTE — MAU Note (Signed)
Pt is a G2P1 at 40.2 weeks c/o ctx since Monday night, today with increased pressure.  No LOF, no VB, positive FM.

## 2017-09-11 NOTE — MAU Note (Signed)
Urine in lab with culture tube 

## 2017-09-12 ENCOUNTER — Inpatient Hospital Stay (HOSPITAL_COMMUNITY): Payer: 59 | Admitting: Anesthesiology

## 2017-09-12 ENCOUNTER — Encounter (HOSPITAL_COMMUNITY): Payer: Self-pay | Admitting: Anesthesiology

## 2017-09-12 ENCOUNTER — Inpatient Hospital Stay (HOSPITAL_COMMUNITY)
Admission: AD | Admit: 2017-09-12 | Discharge: 2017-09-14 | DRG: 807 | Disposition: A | Discharge status: Home / Self Care | Payer: 59 | Source: Ambulatory Visit | Attending: Obstetrics and Gynecology | Admitting: Obstetrics and Gynecology

## 2017-09-12 DIAGNOSIS — Z3A39 39 weeks gestation of pregnancy: Secondary | ICD-10-CM | POA: Diagnosis not present

## 2017-09-12 DIAGNOSIS — Z3A40 40 weeks gestation of pregnancy: Secondary | ICD-10-CM

## 2017-09-12 DIAGNOSIS — Z3483 Encounter for supervision of other normal pregnancy, third trimester: Secondary | ICD-10-CM | POA: Diagnosis present

## 2017-09-12 LAB — TYPE AND SCREEN
ABO/RH(D): O POS
ANTIBODY SCREEN: NEGATIVE

## 2017-09-12 LAB — CBC
HEMATOCRIT: 35.5 % — AB (ref 36.0–46.0)
HEMOGLOBIN: 12.5 g/dL (ref 12.0–15.0)
MCH: 31.7 pg (ref 26.0–34.0)
MCHC: 35.2 g/dL (ref 30.0–36.0)
MCV: 90.1 fL (ref 78.0–100.0)
Platelets: 156 10*3/uL (ref 150–400)
RBC: 3.94 MIL/uL (ref 3.87–5.11)
RDW: 13.4 % (ref 11.5–15.5)
WBC: 13.5 10*3/uL — ABNORMAL HIGH (ref 4.0–10.5)

## 2017-09-12 LAB — ABO/RH: ABO/RH(D): O POS

## 2017-09-12 MED ORDER — LIDOCAINE HCL (PF) 1 % IJ SOLN
30.0000 mL | INTRAMUSCULAR | Status: DC | PRN
  Filled 2017-09-12: qty 30

## 2017-09-12 MED ORDER — PHENYLEPHRINE 40 MCG/ML (10ML) SYRINGE FOR IV PUSH (FOR BLOOD PRESSURE SUPPORT)
80.0000 ug | PREFILLED_SYRINGE | INTRAVENOUS | Status: DC | PRN
  Filled 2017-09-12: qty 5

## 2017-09-12 MED ORDER — ACETAMINOPHEN 325 MG PO TABS: 650.0000 mg | ORAL_TABLET | ORAL | Status: DC | PRN

## 2017-09-12 MED ORDER — BENZOCAINE-MENTHOL 20-0.5 % EX AERO
1.0000 "application " | INHALATION_SPRAY | CUTANEOUS | Status: DC | PRN
  Administered 2017-09-14: 1 via TOPICAL
  Filled 2017-09-12 (×2): qty 56

## 2017-09-12 MED ORDER — SOD CITRATE-CITRIC ACID 500-334 MG/5ML PO SOLN
30.0000 mL | ORAL | Status: DC | PRN
  Administered 2017-09-12: 30 mL via ORAL
  Filled 2017-09-12: qty 15

## 2017-09-12 MED ORDER — LACTATED RINGERS IV SOLN: 500.0000 mL | Freq: Once | INTRAVENOUS | Status: DC

## 2017-09-12 MED ORDER — PHENYLEPHRINE 40 MCG/ML (10ML) SYRINGE FOR IV PUSH (FOR BLOOD PRESSURE SUPPORT)
PREFILLED_SYRINGE | INTRAVENOUS | Status: AC
  Filled 2017-09-12: qty 20

## 2017-09-12 MED ORDER — COCONUT OIL OIL
1.0000 "application " | TOPICAL_OIL | Status: DC | PRN
  Administered 2017-09-14: 1 via TOPICAL
  Filled 2017-09-12: qty 120

## 2017-09-12 MED ORDER — EPHEDRINE 5 MG/ML INJ
10.0000 mg | INTRAVENOUS | Status: DC | PRN
  Filled 2017-09-12: qty 2

## 2017-09-12 MED ORDER — SENNOSIDES-DOCUSATE SODIUM 8.6-50 MG PO TABS
2.0000 | ORAL_TABLET | ORAL | Status: DC
  Administered 2017-09-12 – 2017-09-13 (×2): 2 via ORAL
  Filled 2017-09-12 (×2): qty 2

## 2017-09-12 MED ORDER — LACTATED RINGERS IV SOLN: INTRAVENOUS | Status: DC

## 2017-09-12 MED ORDER — LACTATED RINGERS IV SOLN
500.0000 mL | INTRAVENOUS | Status: DC | PRN
  Administered 2017-09-12: 1000 mL via INTRAVENOUS

## 2017-09-12 MED ORDER — OXYCODONE HCL 5 MG PO TABS: 10.0000 mg | ORAL_TABLET | ORAL | Status: DC | PRN

## 2017-09-12 MED ORDER — SIMETHICONE 80 MG PO CHEW: 80.0000 mg | CHEWABLE_TABLET | ORAL | Status: DC | PRN

## 2017-09-12 MED ORDER — DIBUCAINE 1 % RE OINT: 1.0000 "application " | TOPICAL_OINTMENT | RECTAL | Status: DC | PRN

## 2017-09-12 MED ORDER — OXYCODONE-ACETAMINOPHEN 5-325 MG PO TABS: 1.0000 | ORAL_TABLET | ORAL | Status: DC | PRN

## 2017-09-12 MED ORDER — PRENATAL MULTIVITAMIN CH
1.0000 | ORAL_TABLET | Freq: Every day | ORAL | Status: DC
  Administered 2017-09-13 – 2017-09-14 (×2): 1 via ORAL
  Filled 2017-09-12 (×2): qty 1

## 2017-09-12 MED ORDER — FLEET ENEMA 7-19 GM/118ML RE ENEM: 1.0000 | ENEMA | RECTAL | Status: DC | PRN

## 2017-09-12 MED ORDER — OXYCODONE HCL 5 MG PO TABS: 5.0000 mg | ORAL_TABLET | ORAL | Status: DC | PRN

## 2017-09-12 MED ORDER — ONDANSETRON HCL 4 MG/2ML IJ SOLN
4.0000 mg | INTRAMUSCULAR | Status: DC | PRN
  Filled 2017-09-12: qty 2

## 2017-09-12 MED ORDER — DIPHENHYDRAMINE HCL 25 MG PO CAPS: 25.0000 mg | ORAL_CAPSULE | Freq: Four times a day (QID) | ORAL | Status: DC | PRN

## 2017-09-12 MED ORDER — FENTANYL 2.5 MCG/ML BUPIVACAINE 1/10 % EPIDURAL INFUSION (WH - ANES)
INTRAMUSCULAR | Status: AC
  Filled 2017-09-12: qty 100

## 2017-09-12 MED ORDER — TERBUTALINE SULFATE 1 MG/ML IJ SOLN
0.2500 mg | Freq: Once | INTRAMUSCULAR | Status: DC | PRN
  Filled 2017-09-12: qty 1

## 2017-09-12 MED ORDER — WITCH HAZEL-GLYCERIN EX PADS: 1.0000 "application " | MEDICATED_PAD | CUTANEOUS | Status: DC | PRN

## 2017-09-12 MED ORDER — ONDANSETRON HCL 4 MG PO TABS: 4.0000 mg | ORAL_TABLET | ORAL | Status: DC | PRN

## 2017-09-12 MED ORDER — ZOLPIDEM TARTRATE 5 MG PO TABS: 5.0000 mg | ORAL_TABLET | Freq: Every evening | ORAL | Status: DC | PRN

## 2017-09-12 MED ORDER — FENTANYL 2.5 MCG/ML BUPIVACAINE 1/10 % EPIDURAL INFUSION (WH - ANES)
14.0000 mL/h | INTRAMUSCULAR | Status: DC | PRN
  Administered 2017-09-12 (×2): 14 mL/h via EPIDURAL

## 2017-09-12 MED ORDER — OXYTOCIN 40 UNITS IN LACTATED RINGERS INFUSION - SIMPLE MED: 1.0000 m[IU]/min | INTRAVENOUS | Status: DC

## 2017-09-12 MED ORDER — OXYTOCIN 40 UNITS IN LACTATED RINGERS INFUSION - SIMPLE MED
2.5000 [IU]/h | INTRAVENOUS | Status: DC
  Administered 2017-09-12: 2.5 [IU]/h via INTRAVENOUS
  Filled 2017-09-12: qty 1000

## 2017-09-12 MED ORDER — OXYTOCIN BOLUS FROM INFUSION
500.0000 mL | Freq: Once | INTRAVENOUS | Status: AC
  Administered 2017-09-12: 500 mL via INTRAVENOUS

## 2017-09-12 MED ORDER — OXYCODONE-ACETAMINOPHEN 5-325 MG PO TABS: 2.0000 | ORAL_TABLET | ORAL | Status: DC | PRN

## 2017-09-12 MED ORDER — DIPHENHYDRAMINE HCL 50 MG/ML IJ SOLN: 12.5000 mg | INTRAMUSCULAR | Status: DC | PRN

## 2017-09-12 MED ORDER — ONDANSETRON HCL 4 MG/2ML IJ SOLN: 4.0000 mg | Freq: Four times a day (QID) | INTRAMUSCULAR | Status: DC | PRN

## 2017-09-12 MED ORDER — LIDOCAINE HCL (PF) 1 % IJ SOLN
INTRAMUSCULAR | Status: DC | PRN
  Administered 2017-09-12 (×2): 4 mL via EPIDURAL

## 2017-09-12 MED ORDER — FERROUS SULFATE 325 (65 FE) MG PO TABS
325.0000 mg | ORAL_TABLET | Freq: Two times a day (BID) | ORAL | Status: DC
  Administered 2017-09-13 – 2017-09-14 (×3): 325 mg via ORAL
  Filled 2017-09-12 (×3): qty 1

## 2017-09-12 MED ORDER — IBUPROFEN 600 MG PO TABS
600.0000 mg | ORAL_TABLET | Freq: Four times a day (QID) | ORAL | Status: DC
  Administered 2017-09-12 – 2017-09-14 (×7): 600 mg via ORAL
  Filled 2017-09-12 (×7): qty 1

## 2017-09-12 NOTE — H&P (Signed)
Sara Moran is Sara 32 y.o. female presenting @ term gestation in labor. Intact membrane OB History    Gravida Para Term Preterm AB Living   2 1       1    SAB TAB Ectopic Multiple Live Births                 Past Medical History:  Diagnosis Date  . HA (headache) 03/2009   migraine and also mild recurrent HAs  . Pneumonia    dx RML 2008  . Pneumonia 08/2008   Past Surgical History:  Procedure Laterality Date  . BREAST ENHANCEMENT SURGERY  03-2009   silicone  . DENTAL SURGERY     Family History: family history includes Diabetes in her unknown relative; Heart attack in her unknown relative; Heart disease in her maternal grandmother and paternal grandmother. Social History:  reports that  has never smoked. she has never used smokeless tobacco. She reports that she drinks alcohol. She reports that she does not use drugs.     Maternal Diabetes: No Genetic Screening: Normal Maternal Ultrasounds/Referrals: Normal Fetal Ultrasounds or other Referrals:  None Maternal Substance Abuse:  No Significant Maternal Medications:  None Significant Maternal Lab Results:  Lab values include: Group B Strep negative Other Comments:  None  Review of Systems  All other systems reviewed and are negative.  Maternal Medical History:  Reason for admission: Contractions.   Contractions: Frequency: regular.    Fetal activity: Perceived fetal activity is normal.    Prenatal complications: no prenatal complications Prenatal Complications - Diabetes: none.    Dilation: 5 Effacement (%): 90 Station: -1 Exam by:: Dr. Cherly Hensenousins Blood pressure 126/74, pulse 72, temperature 98.3 F (36.8 C), temperature source Oral, resp. rate 18, height 5\' 5"  (1.651 m), weight 83 kg (183 lb), last menstrual period 12/03/2016, SpO2 99 %. Exam Physical Exam  Constitutional: She is oriented to person, place, and time. She appears well-developed and well-nourished.  HENT:  Head: Atraumatic.  Neurological: She  is alert and oriented to person, place, and time.  Skin: Skin is warm and dry.    Prenatal labs: ABO, Rh: --/--/O POS (02/15 1650) Antibody: NEG (02/15 1650) Rubella: Immune (07/13 0000) RPR: Nonreactive (07/13 0000)  HBsAg: Negative (07/13 0000)  HIV: Non-reactive (07/13 0000)  GBS: Negative (01/16 0000)   Assessment/Plan: Active labor Term gestation P) admit routine lab. Amniotomy. Pitocin prn. epidural  Sara Moran Sara Moran 09/12/2017, 6:18 PM

## 2017-09-12 NOTE — Anesthesia Procedure Notes (Signed)
Epidural Patient location during procedure: OB Start time: 09/12/2017 5:26 PM  Staffing Anesthesiologist: Mal AmabileFoster, Gavina Dildine, MD  Preanesthetic Checklist Completed: patient identified, site marked, surgical consent, pre-op evaluation, timeout performed, IV checked, risks and benefits discussed and monitors and equipment checked  Epidural Patient position: sitting Prep: site prepped and draped and DuraPrep Patient monitoring: continuous pulse ox and blood pressure Approach: midline Location: L3-L4 Injection technique: LOR air  Needle:  Needle type: Tuohy  Needle gauge: 17 G Needle length: 9 cm and 9 Needle insertion depth: 4 cm Catheter type: closed end flexible Catheter size: 19 Gauge Catheter at skin depth: 9 cm Test dose: negative and Other  Assessment Events: blood not aspirated, injection not painful, no injection resistance, negative IV test and no paresthesia  Additional Notes Patient identified. Risks and benefits discussed including failed block, incomplete  Pain control, post dural puncture headache, nerve damage, paralysis, blood pressure Changes, nausea, vomiting, reactions to medications-both toxic and allergic and post Partum back pain. All questions were answered. Patient expressed understanding and wished to proceed. Sterile technique was used throughout procedure. Epidural site was Dressed with sterile barrier dressing. No paresthesias, signs of intravascular injection Or signs of intrathecal spread were encountered.  Patient was more comfortable after the epidural was dosed. Please see RN's note for documentation of vital signs and FHR which are stable.

## 2017-09-12 NOTE — Anesthesia Preprocedure Evaluation (Addendum)
Anesthesia Evaluation  Patient identified by MRN, date of birth, ID band Patient awake    Reviewed: Allergy & Precautions, NPO status , Patient's Chart, lab work & pertinent test results  Airway Mallampati: II  TM Distance: >3 FB Neck ROM: Full    Dental  (+) Teeth Intact   Pulmonary pneumonia, resolved,    Pulmonary exam normal breath sounds clear to auscultation       Cardiovascular negative cardio ROS Normal cardiovascular exam Rhythm:Regular Rate:Normal     Neuro/Psych  Headaches, negative psych ROS   GI/Hepatic negative GI ROS, Neg liver ROS,   Endo/Other  negative endocrine ROS  Renal/GU negative Renal ROS  negative genitourinary   Musculoskeletal negative musculoskeletal ROS (+)   Abdominal   Peds  Hematology negative hematology ROS (+)   Anesthesia Other Findings   Reproductive/Obstetrics (+) Pregnancy                             Anesthesia Physical Anesthesia Plan  ASA: II  Anesthesia Plan: Epidural   Post-op Pain Management:    Induction:   PONV Risk Score and Plan:   Airway Management Planned: Natural Airway  Additional Equipment:   Intra-op Plan:   Post-operative Plan:   Informed Consent: I have reviewed the patients History and Physical, chart, labs and discussed the procedure including the risks, benefits and alternatives for the proposed anesthesia with the patient or authorized representative who has indicated his/her understanding and acceptance.     Plan Discussed with: Anesthesiologist  Anesthesia Plan Comments:         Anesthesia Quick Evaluation

## 2017-09-13 LAB — RPR: RPR: NONREACTIVE

## 2017-09-13 NOTE — Anesthesia Postprocedure Evaluation (Signed)
Anesthesia Post Note  Patient: Sara KelchMaria [place] Moran  Procedure(s) Performed: AN AD HOC LABOR EPIDURAL     Patient location during evaluation: Mother Baby Anesthesia Type: Epidural Level of consciousness: awake and alert and oriented Pain management: satisfactory to patient Vital Signs Assessment: post-procedure vital signs reviewed and stable Respiratory status: respiratory function stable Cardiovascular status: stable Postop Assessment: no headache, no backache, epidural receding, patient able to bend at knees, no signs of nausea or vomiting and adequate PO intake Anesthetic complications: no    Last Vitals:  Vitals:   09/12/17 2327 09/13/17 0227  BP: 104/64 115/60  Pulse: (!) 57 61  Resp: 18 16  Temp: 37.1 C 36.7 C  SpO2:      Last Pain:  Vitals:   09/13/17 0933  TempSrc:   PainSc: 0-No pain   Pain Goal: Patients Stated Pain Goal: 3 (09/13/17 0450)               Karleen DolphinFUSSELL,Adilson Grafton

## 2017-09-13 NOTE — Progress Notes (Signed)
PPD # 1 SVD Information for the patient's newborn:  [name], [name] [[id number]][[id number]]  female      breast feeding  Baby name: Thomes DinningMelania  S:  Reports feeling well.             Tolerating po/ No nausea or vomiting             Bleeding is moderate             Pain controlled with ibuprofen (OTC)             Up ad lib / ambulatory / voiding without difficulties        O:  A & O x 3, in no apparent distress              VS:  Vitals:   09/12/17 2145 09/12/17 2210 09/12/17 2327 09/13/17 0227  BP: 113/62 (!) 109/54 104/64 115/60  Pulse: 66 (!) 57 (!) 57 61  Resp: 19 18 18 16   Temp:  99.1 F (37.3 C) 98.8 F (37.1 C) 98 F (36.7 C)  TempSrc:  Oral Oral Oral  SpO2:      Weight:      Height:        LABS:  Recent Labs    09/12/17 1650  WBC 13.5*  HGB 12.5  HCT 35.5*  PLT 156    Blood type: --/--/O POS, O POS Performed at [facility], [address] [place], [place] [address]  (02/15 1650)  Rubella: Immune (07/13 0000)   I&O: I/O last 3 completed shifts: In: -  Out: 550 [Urine:300; Blood:250]          No intake/output data recorded.  Lungs: Clear and unlabored  Heart: regular rate and rhythm / no murmurs  Abdomen: soft, non-tender, non-distended             Fundus: firm, non-tender, U-1  Perineum: mild edema, repair intact  Lochia: moderate  Extremities: no edema, no calf pain or tenderness    A/P: PPD # 1 31 y.o., [id number]   Principal Problem:   NSVD (normal spontaneous vaginal delivery) 2/15 Active Problems:   Normal labor   Postpartum care following vaginal delivery   Obstetrical laceration - Left vaginal sulcus   Doing well - stable status  Routine post partum orders  Anticipate discharge tomorrow    Neta Mendsaniela C Thurman Sarver, MSN, CNM 09/13/2017, 11:25 AM

## 2017-09-14 MED ORDER — BENZOCAINE-MENTHOL 20-0.5 % EX AERO: 1.0000 "application " | INHALATION_SPRAY | CUTANEOUS | Status: AC | PRN

## 2017-09-14 MED ORDER — IBUPROFEN 600 MG PO TABS: 600.0000 mg | ORAL_TABLET | Freq: Four times a day (QID) | ORAL | 0 refills | Status: AC

## 2017-09-14 MED ORDER — SENNOSIDES-DOCUSATE SODIUM 8.6-50 MG PO TABS: 2.0000 | ORAL_TABLET | ORAL | Status: AC

## 2017-09-14 MED ORDER — COCONUT OIL OIL: 1.0000 "application " | TOPICAL_OIL | 0 refills | Status: AC | PRN

## 2017-09-14 NOTE — Lactation Note (Signed)
This note was copied from a baby's chart. Lactation Consultation Note  Patient Name: Sara Moran ZOXWR'UToday's Date: 09/14/2017 Reason for consult: Initial assessment   P2, Baby 40 hours old.   Mother has history of periareolar incisions breast implants 10 years ago. With her first child she breastfed and supplemented with formula and has been doing the same with this child. States she her supply with her first was better on the R breast than the L side so mother favored R side and eventually exclusively breastfed on R only. Encouraged her to breastfeed on both breasts per sessions and give supplement after breastfeeding if baby is still cueing. Provided volume guidelines for formula or breastmilk supplementation. Discussed post pumping and referred mother to [email or web address]. Mom encouraged to feed baby 8-12 times/24 hours and with feeding cues.  Reviewed engorgement care and monitoring voids/stools. Mom made aware of O/P services, breastfeeding support groups, community resources, and our phone # for post-discharge questions.     Maternal Data Has patient been taught Hand Expression?: Yes Does the patient have breastfeeding experience prior to this delivery?: Yes  Feeding    LATCH Score                   Interventions Interventions: Breast feeding basics reviewed  Lactation Tools Discussed/Used     Consult Status Consult Status: Complete    Sara Moran, Sara Moran 09/14/2017, 12:32 PM

## 2017-09-14 NOTE — Discharge Summary (Signed)
Obstetric Discharge Summary Reason for Admission: onset of labor Prenatal Procedures: ultrasound Intrapartum Procedures: spontaneous vaginal delivery Postpartum Procedures: none Complications-Operative and Postpartum: left vaginal sulcus Hemoglobin  Date Value Ref Range Status  09/12/2017 12.5 12.0 - 15.0 g/dL Final   HCT  Date Value Ref Range Status  09/12/2017 35.5 (L) 36.0 - 46.0 % Final    Physical Exam:  General: alert, cooperative and no distress Lochia: appropriate Uterine Fundus: firm Incision: healing well DVT Evaluation: No cords or calf tenderness. No significant calf/ankle edema.  Discharge Diagnoses: Term Pregnancy-delivered  Discharge Information: Date: 09/14/2017 Activity: pelvic rest Diet: routine Medications:  Allergies as of 09/14/2017   No Known Allergies     Medication List    TAKE these medications   acetaminophen 500 MG tablet Commonly known as:  TYLENOL Take 500 mg by mouth daily as needed for headache.   benzocaine-Menthol 20-0.5 % Aero Commonly known as:  DERMOPLAST Apply 1 application topically as needed for irritation (perineal discomfort).   coconut oil Oil Apply 1 application topically as needed.   ibuprofen 600 MG tablet Commonly known as:  ADVIL,MOTRIN Take 1 tablet (600 mg total) by mouth every 6 (six) hours.   prenatal multivitamin Tabs tablet Take 1 tablet by mouth daily.   senna-docusate 8.6-50 MG tablet Commonly known as:  Senokot-S Take 2 tablets by mouth daily. Start taking on:  09/15/2017            Discharge Care Instructions  (From admission, onward)        Start     Ordered   09/14/17 0000  Discharge wound care:    Comments:  Sitz baths 2 times /day with warm water x 1 week   09/14/17 1045     Condition: stable Instructions: refer to practice specific booklet Discharge to: home Follow-up Information    Maxie Betterousins, Sheronette, MD. Schedule an appointment as soon as possible for a visit in 6 week(s).    Specialty:  Obstetrics and Gynecology Contact information: [address] [place] [id number] [phone]           Newborn Data: Live born female Thomes DinningMelania Birth Weight: 9 lb 4.6 oz (4213 g) APGAR: ,   Newborn Delivery   Birth date/time:  09/12/2017 20:07:00 Delivery type:  Vaginal, Spontaneous     Home with mother.  Neta MendsDaniela C Takahiro Godinho, CNM 09/14/2017, 10:45 AM

## 2017-11-17 DIAGNOSIS — Z130 Encounter for screening for diseases of the blood and blood-forming organs and certain disorders involving the immune mechanism: Secondary | ICD-10-CM | POA: Diagnosis not present

## 2017-11-17 DIAGNOSIS — R69 Illness, unspecified: Secondary | ICD-10-CM | POA: Diagnosis not present
